# Patient Record
Sex: Male | Born: 1940 | Race: White | Hispanic: No | Marital: Married | Smoking: Former smoker
Health system: Southern US, Community
[De-identification: ages and names within clinical notes are randomized; demographics above are authoritative.]

## PROBLEM LIST (undated history)

## (undated) DIAGNOSIS — Z9889 Other specified postprocedural states: Secondary | ICD-10-CM

## (undated) DIAGNOSIS — R112 Nausea with vomiting, unspecified: Secondary | ICD-10-CM

## (undated) DIAGNOSIS — I499 Cardiac arrhythmia, unspecified: Secondary | ICD-10-CM

## (undated) DIAGNOSIS — Z8489 Family history of other specified conditions: Secondary | ICD-10-CM

## (undated) DIAGNOSIS — K274 Chronic or unspecified peptic ulcer, site unspecified, with hemorrhage: Secondary | ICD-10-CM

## (undated) DIAGNOSIS — M1990 Unspecified osteoarthritis, unspecified site: Secondary | ICD-10-CM

## (undated) DIAGNOSIS — C801 Malignant (primary) neoplasm, unspecified: Secondary | ICD-10-CM

## (undated) DIAGNOSIS — J189 Pneumonia, unspecified organism: Secondary | ICD-10-CM

## (undated) DIAGNOSIS — J45909 Unspecified asthma, uncomplicated: Secondary | ICD-10-CM

## (undated) HISTORY — PX: ROTATOR CUFF REPAIR: SHX139

---
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
## 1966-10-25 HISTORY — PX: APPENDECTOMY: SHX54

## 1980-10-25 DIAGNOSIS — K284 Chronic or unspecified gastrojejunal ulcer with hemorrhage: Secondary | ICD-10-CM

## 1980-10-25 DIAGNOSIS — K274 Chronic or unspecified peptic ulcer, site unspecified, with hemorrhage: Secondary | ICD-10-CM

## 1980-10-25 HISTORY — DX: Chronic or unspecified gastrojejunal ulcer with hemorrhage: K28.4

## 1980-10-25 HISTORY — DX: Chronic or unspecified peptic ulcer, site unspecified, with hemorrhage: K27.4

## 1999-10-26 HISTORY — PX: EYE SURGERY: SHX253

## 1999-10-26 HISTORY — PX: CHOLECYSTECTOMY: SHX55

## 2006-08-15 ENCOUNTER — Ambulatory Visit: Payer: Self-pay

## 2006-09-08 ENCOUNTER — Ambulatory Visit: Payer: Self-pay | Admitting: Internal Medicine

## 2006-10-25 DIAGNOSIS — C801 Malignant (primary) neoplasm, unspecified: Secondary | ICD-10-CM

## 2006-10-25 HISTORY — DX: Malignant (primary) neoplasm, unspecified: C80.1

## 2007-04-21 DIAGNOSIS — C4491 Basal cell carcinoma of skin, unspecified: Secondary | ICD-10-CM

## 2007-04-21 HISTORY — DX: Basal cell carcinoma of skin, unspecified: C44.91

## 2008-05-21 ENCOUNTER — Ambulatory Visit: Payer: Self-pay | Admitting: Internal Medicine

## 2008-09-11 ENCOUNTER — Ambulatory Visit: Payer: Self-pay | Admitting: Urology

## 2008-10-07 ENCOUNTER — Ambulatory Visit: Payer: Self-pay | Admitting: Specialist

## 2008-12-11 ENCOUNTER — Ambulatory Visit: Payer: Self-pay | Admitting: Internal Medicine

## 2009-08-05 ENCOUNTER — Ambulatory Visit: Payer: Self-pay

## 2009-10-15 ENCOUNTER — Ambulatory Visit: Payer: Self-pay | Admitting: Physician Assistant

## 2010-12-29 ENCOUNTER — Ambulatory Visit: Payer: Self-pay | Admitting: Internal Medicine

## 2012-07-19 ENCOUNTER — Ambulatory Visit: Payer: Self-pay | Admitting: Internal Medicine

## 2012-09-27 DIAGNOSIS — R972 Elevated prostate specific antigen [PSA]: Secondary | ICD-10-CM | POA: Insufficient documentation

## 2012-09-27 DIAGNOSIS — N529 Male erectile dysfunction, unspecified: Secondary | ICD-10-CM | POA: Insufficient documentation

## 2012-09-27 DIAGNOSIS — Z8042 Family history of malignant neoplasm of prostate: Secondary | ICD-10-CM | POA: Insufficient documentation

## 2012-09-27 DIAGNOSIS — K4090 Unilateral inguinal hernia, without obstruction or gangrene, not specified as recurrent: Secondary | ICD-10-CM | POA: Insufficient documentation

## 2012-09-27 DIAGNOSIS — N403 Nodular prostate with lower urinary tract symptoms: Secondary | ICD-10-CM | POA: Insufficient documentation

## 2013-01-19 ENCOUNTER — Inpatient Hospital Stay: Payer: Self-pay | Admitting: Internal Medicine

## 2013-01-19 LAB — BASIC METABOLIC PANEL
Anion Gap: 4 — ABNORMAL LOW (ref 7–16)
BUN: 16 mg/dL (ref 7–18)
Chloride: 105 mmol/L (ref 98–107)
EGFR (Non-African Amer.): >60
Osmolality: 276 (ref 275–301)
Sodium: 138 mmol/L (ref 136–145)

## 2013-01-19 LAB — CBC WITH DIFFERENTIAL/PLATELET
Basophil %: 0.6 %
Eosinophil %: 0.5 %
HGB: 15.2 g/dL (ref 13.0–18.0)
Lymphocyte #: 2.2 10*3/uL (ref 1.0–3.6)
Lymphocyte %: 19.8 %
MCH: 29.6 pg (ref 26.0–34.0)
MCHC: 33.8 g/dL (ref 32.0–36.0)
MCV: 88 fL (ref 80–100)
Monocyte #: 0.9 x10 3/mm (ref 0.2–1.0)
Neutrophil %: 71.4 %
Platelet: 152 10*3/uL (ref 150–440)
RDW: 13.5 % (ref 11.5–14.5)
WBC: 11.1 10*3/uL — ABNORMAL HIGH (ref 3.8–10.6)

## 2013-01-20 LAB — CBC WITH DIFFERENTIAL/PLATELET
Basophil #: 0.1 x10 3/mm 3
Basophil %: 0.7 %
Eosinophil #: 0 x10 3/mm 3
Eosinophil %: 0.6 %
HCT: 41.7 %
HGB: 14.1 g/dL
Lymphocyte %: 27.9 %
Lymphs Abs: 2.3 x10 3/mm 3
MCH: 29.5 pg
MCHC: 33.7 g/dL
MCV: 87 fL
Monocyte #: 0.9 "x10 3/mm "
Monocyte %: 10.3 %
Neutrophil #: 5.1 x10 3/mm 3
Neutrophil %: 60.5 %
Platelet: 140 x10 3/mm 3 — ABNORMAL LOW
RBC: 4.77 x10 6/mm 3
RDW: 14.1 %
WBC: 8.4 x10 3/mm 3

## 2013-01-20 LAB — BASIC METABOLIC PANEL WITH GFR
Anion Gap: 6 — ABNORMAL LOW
BUN: 15 mg/dL
Calcium, Total: 8.1 mg/dL — ABNORMAL LOW
Chloride: 107 mmol/L
Co2: 26 mmol/L
Creatinine: 1.04 mg/dL
EGFR (African American): >60
EGFR (Non-African Amer.): >60
Glucose: 92 mg/dL
Osmolality: 278
Potassium: 3.7 mmol/L
Sodium: 139 mmol/L

## 2013-01-21 LAB — CBC WITH DIFFERENTIAL/PLATELET
Basophil #: 0 10*3/uL (ref 0.0–0.1)
Eosinophil #: 0.3 10*3/uL (ref 0.0–0.7)
HGB: 13.9 g/dL (ref 13.0–18.0)
Lymphocyte #: 2.4 10*3/uL (ref 1.0–3.6)
Lymphocyte %: 37.9 %
MCHC: 34.9 g/dL (ref 32.0–36.0)
MCV: 86 fL (ref 80–100)
Monocyte %: 11.1 %
Neutrophil #: 2.9 10*3/uL (ref 1.4–6.5)
Platelet: 142 10*3/uL — ABNORMAL LOW (ref 150–440)
RDW: 13.7 % (ref 11.5–14.5)

## 2013-09-10 ENCOUNTER — Ambulatory Visit: Payer: Self-pay | Admitting: Unknown Physician Specialty

## 2013-10-01 DIAGNOSIS — N2889 Other specified disorders of kidney and ureter: Secondary | ICD-10-CM | POA: Insufficient documentation

## 2014-05-23 DIAGNOSIS — I499 Cardiac arrhythmia, unspecified: Secondary | ICD-10-CM | POA: Insufficient documentation

## 2014-05-23 DIAGNOSIS — J449 Chronic obstructive pulmonary disease, unspecified: Secondary | ICD-10-CM | POA: Insufficient documentation

## 2014-06-17 ENCOUNTER — Ambulatory Visit (INDEPENDENT_AMBULATORY_CARE_PROVIDER_SITE_OTHER): Payer: Medicare Other

## 2014-06-17 ENCOUNTER — Encounter: Payer: Self-pay | Admitting: Podiatry

## 2014-06-17 ENCOUNTER — Ambulatory Visit (INDEPENDENT_AMBULATORY_CARE_PROVIDER_SITE_OTHER): Payer: Medicare Other | Admitting: Podiatry

## 2014-06-17 VITALS — BP 117/75 | HR 52 | Resp 16 | Ht 74.0 in | Wt 215.0 lb

## 2014-06-17 DIAGNOSIS — G589 Mononeuropathy, unspecified: Secondary | ICD-10-CM

## 2014-06-17 DIAGNOSIS — G629 Polyneuropathy, unspecified: Secondary | ICD-10-CM

## 2014-06-17 NOTE — Progress Notes (Signed)
   Subjective:    Patient ID: Keith Gordon, male    DOB: 08/01/1941, 73 y.o.   MRN: [id number]  HPI Comments: Its both of my feet and toes. They hurt, burn and sting. Its been going on since January. Its getting worse. It i walk a long time my legs get tired. i dont do anything for my feet.  Foot Pain Associated symptoms include chills, a fever and headaches.      Review of Systems  Constitutional: Positive for fever and chills.  HENT:       Ringing in ears  Respiratory:       Difficulty breathing  Endocrine: Positive for cold intolerance.  Neurological: Positive for headaches.  Hematological: Bruises/bleeds easily.  All other systems reviewed and are negative.      Objective:   Physical Exam: I have reviewed his past medical history medications allergies surgeries social history and review of systems. Pulses are strongly palpable neurologic sensorium is diminished per Semmes-Weinstein monofilament to the toes and deep tendon reflexes are in non-elicitable. Muscle strength is 5 over 5 dorsiflexors plantar flexors inverters everters all intrinsic musculature appears to be intact orthopedic evaluation demonstrates all joints distal to the ankle a full range of motion without crepitation. Radiographic evaluation does not demonstrate any type of osseous abnormalities that would contribute to this type of complication. Cutaneous evaluation demonstrates supple well hydrated cutis no erythema edema cellulitis drainage or odor.        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 Currently he is no longer being treated for pernicious anemia and his B12 levels have dropped. He also relates a history of back trauma and arthritis that would be consistent with a radiculopathy or spinal stenosis.  Plan: Discussed etiology pathology conservative versus surgical therapies. At this point due to the weakness in  his legs, a heaviness that he feels in the neuropathic symptoms he exhibits I suggested that he discuss with his primary Dr. starting him back on his B12 therapy. I also suggested that he followup with neurosurgery for a back evaluation. He has had a CT scan of his back previously which did demonstrate arthritis and an area of vascular concern. He has been seen by pain management previously but at that time refused epidurals.

## 2014-06-28 ENCOUNTER — Encounter: Payer: Self-pay | Admitting: *Deleted

## 2014-06-28 NOTE — Progress Notes (Signed)
Dr Milinda Pointer wanted to send pt to [facility] to see dr Ronnald Ramp. Dr Ronnald Ramp office faxed form stating pt declined appt and he is going to see a Restaurant manager, fast food. Pt will call dr Ronnald Ramp office if it does not help.

## 2014-11-13 DIAGNOSIS — R339 Retention of urine, unspecified: Secondary | ICD-10-CM | POA: Insufficient documentation

## 2015-02-14 NOTE — H&P (Signed)
PATIENT NAME:  Keith Gordon, Keith Gordon MR#:  [id number] DATE OF BIRTH:  [birth year]  DATE OF ADMISSION:  01/19/2013  CHIEF COMPLAINT:  Left lower quadrant abdominal pain, difficulty in urination.   HISTORY OF PRESENT ILLNESS: The patient is a 74 year old male with a history of COPD, chronic back pain, diverticulosis who presents to the clinic complaining of pain in the left lower aspect of the abdomen that started 3 days back. He was initially seen by Nani Gasser  PA on the 27th of March and was prescribed a course of Cipro and Flagyl. The patient states that he has been taking this for the last day without any significant improvement in symptoms. He also has been experiencing difficulty in urination and describes a pressure-like feeling prior to urination. The patient also states that he has not had a bowel movement since yesterday morning and has been complaining of nausea. His pain was initially a dull ache, but has progressively gotten worse in the last 24 to 48 hours. He denies any chest pain. No shortness of breath. No ankle edema.   PAST MEDICAL HISTORY:  Significant for gastroesophageal reflux disease, COPD, peptic ulcer disease.   PAST SURGICAL HISTORY:  Significant for tonsillectomy, adenoidectomy, appendectomy, left rotator cuff surgery x 2, tear duct surgery, history of previous cholecystectomy.   FAMILY HISTORY:  Positive for TB in his mother.   CURRENT MEDICATIONS: Advil 200 mg as needed, vitamin B12 IM every 3 months, Prilosec 20 mg once a day, Cipro 5 mg p.o. b.i.d., Flagyl 5 mg t.i.d. started yesterday and Voltaren Gel that he takes as needed.   ALLERGIES:  HE IS ALLERGIC TO SULFA.   PHYSICAL EXAMINATION: VITAL SIGNS:  Weight 250 pounds, blood pressure 100/72, pulse was 90. He appeared to be in significant distress.  HEENT:  NCAT. Pupils equal and reactive to light. Tympanic membranes are clear.  NECK:  Supple. No thyromegaly. LUNGS:  Crackles heard in the left base posteriorly,  scattered inspiratory and expiratory wheezes.  ABDOMEN: Soft. Evidence of tenderness in the left lower quadrant area with guarding. No rebound.  EXTREMITIES:  No edema.  NEUROLOGIC: Alert and oriented x 3. No obvious focal deficits.  LABORATORY DATA DONE TODAY:  Showed  hemoglobin 15.5, WBC count 10.6, platelets 171, creatinine 1.2, BUN 16, calcium 9.6, glucose 84, potassium 4.4, total protein 7. Liver function tests were normal. Sodium 138. Urinalysis was clear. Zero to 1 RBCs, zero WBCs. No bacteria.   IMPRESSION:  Acute diverticulitis.  The patient in significant pain in spite of oral antibiotic therapy. Advised admission to [facility]. Will obtain CT scan of the abdomen and pelvis with contrast. Will also start Demerol 25 mg every 8 hours p.r.n. for pain relief. Continue proton pump inhibitor. At this point we will manage patient medically with IV antibiotics and decide on surgical consultation based on the CT results.     ____________________________ Tracie Harrier, MD vh:ce D: 01/19/2013 17:02:54 ET T: 01/19/2013 17:54:44 ET JOB#: [id number]  cc: Tracie Harrier, MD, <Dictator> Tracie Harrier MD ELECTRONICALLY SIGNED 02/04/2013 13:45

## 2015-02-14 NOTE — Discharge Summary (Signed)
PATIENT NAME:  Keith Gordon, Keith Gordon DATE OF BIRTH:  11/23/1940  DATE OF ADMISSION:  01/19/2013 DATE OF DISCHARGE:  01/21/2013  PRIMARY CARE PHYSICIAN:  Tracie Harrier, MD  DISCHARGING PHYSICIAN:  Cheral Marker. Ola Spurr, MD   DISCHARGE DIAGNOSIS:  Diverticulitis unresponsive to oral antibiotics.   HISTORY OF PRESENT ILLNESS:  Please see admission history and physical. Briefly, this is a 74 year old with known diverticulitis times several episodes in the past. He was seen by Dr. Ginette Pitman as an outpatient and given oral Cipro and Flagyl. However, he worsened acutely and had severe left lower quadrant abdominal pain. He was admitted for further evaluation and treatment.   HOSPITAL COURSE BY ISSUE:  Diverticulitis. CT scan confirmed this, but did not show any abscess or free air. He was started on IV Cipro and Flagyl. Over 2 days, his pain improved and his diarrhea resolved. He had 1 episode of vomiting when he received Demerol. His pain was otherwise controlled with ibuprofen. On the day of discharge, he has mild tenderness to palpation in the left lower quadrant, but no white count and no fever. He is tolerating a full liquid diet.   DISCHARGE MEDICATIONS:   1.  Ciprofloxacin 500 mg twice a day for 10 days.  2.  Flagyl 500 mg 3 times daily for 10 days.  3.  Advil 200 mg 2 tablets once to twice a day as needed for pain.   DISCHARGE DIET:  Low sodium, full liquid for 1 to 2 days, then can resume regular.   DISCHARGE ACTIVITY:  As tolerated.   FOLLOWUP:  The patient is to follow up with Dr. Ginette Pitman in 1 to 2 weeks. The patient will call if he has worsening symptoms including abdominal pain, nausea, vomiting, diarrhea or other concerning symptoms.   TIME SPENT:  This discharge took 35 minutes.    ____________________________ Cheral Marker. Ola Spurr, MD dpf:si D: 01/21/2013 12:52:52 ET T: 01/21/2013 15:29:39 ET JOB#: [id number]  cc: Cheral Marker. Ola Spurr, MD, <Dictator> Emmaleigh Longo Ola Spurr  MD ELECTRONICALLY SIGNED 01/24/2013 15:43

## 2015-06-19 DIAGNOSIS — L570 Actinic keratosis: Secondary | ICD-10-CM

## 2015-06-19 HISTORY — DX: Actinic keratosis: L57.0

## 2016-02-23 DIAGNOSIS — M25579 Pain in unspecified ankle and joints of unspecified foot: Secondary | ICD-10-CM | POA: Insufficient documentation

## 2016-07-16 ENCOUNTER — Other Ambulatory Visit: Payer: Self-pay | Admitting: Internal Medicine

## 2016-07-16 DIAGNOSIS — M25552 Pain in left hip: Secondary | ICD-10-CM

## 2016-07-20 ENCOUNTER — Ambulatory Visit
Admission: RE | Admit: 2016-07-20 | Discharge: 2016-07-20 | Disposition: A | Discharge status: Home / Self Care | Payer: No Typology Code available for payment source | Source: Ambulatory Visit | Attending: Internal Medicine | Admitting: Internal Medicine

## 2016-07-20 DIAGNOSIS — X58XXXA Exposure to other specified factors, initial encounter: Secondary | ICD-10-CM | POA: Diagnosis not present

## 2016-07-20 DIAGNOSIS — S76012A Strain of muscle, fascia and tendon of left hip, initial encounter: Secondary | ICD-10-CM | POA: Insufficient documentation

## 2016-07-20 DIAGNOSIS — M25552 Pain in left hip: Secondary | ICD-10-CM | POA: Diagnosis present

## 2016-12-23 DIAGNOSIS — B3742 Candidal balanitis: Secondary | ICD-10-CM | POA: Insufficient documentation

## 2017-02-07 ENCOUNTER — Encounter
Admission: RE | Admit: 2017-02-07 | Discharge: 2017-02-07 | Disposition: A | Discharge status: Home / Self Care | Payer: Medicare Other | Source: Ambulatory Visit | Attending: Surgery | Admitting: Surgery

## 2017-02-07 DIAGNOSIS — R001 Bradycardia, unspecified: Secondary | ICD-10-CM | POA: Insufficient documentation

## 2017-02-07 DIAGNOSIS — Z136 Encounter for screening for cardiovascular disorders: Secondary | ICD-10-CM

## 2017-02-07 DIAGNOSIS — M76892 Other specified enthesopathies of left lower limb, excluding foot: Secondary | ICD-10-CM | POA: Insufficient documentation

## 2017-02-07 DIAGNOSIS — Z01818 Encounter for other preprocedural examination: Secondary | ICD-10-CM | POA: Diagnosis present

## 2017-02-07 DIAGNOSIS — J449 Chronic obstructive pulmonary disease, unspecified: Secondary | ICD-10-CM | POA: Insufficient documentation

## 2017-02-07 HISTORY — DX: Chronic or unspecified peptic ulcer, site unspecified, with hemorrhage: K27.4

## 2017-02-07 HISTORY — DX: Family history of other specified conditions: Z84.89

## 2017-02-07 HISTORY — DX: Other specified postprocedural states: Z98.890

## 2017-02-07 HISTORY — DX: Unspecified osteoarthritis, unspecified site: M19.90

## 2017-02-07 HISTORY — DX: Pneumonia, unspecified organism: J18.9

## 2017-02-07 HISTORY — DX: Malignant (primary) neoplasm, unspecified: C80.1

## 2017-02-07 HISTORY — DX: Unspecified asthma, uncomplicated: J45.909

## 2017-02-07 HISTORY — DX: Nausea with vomiting, unspecified: R11.2

## 2017-02-07 HISTORY — DX: Cardiac arrhythmia, unspecified: I49.9

## 2017-02-07 NOTE — Pre-Procedure Instructions (Signed)
Pt reports severe nausea and vomiting with his lap chole he was admitted for 2 days to control nausea and vomiting.  Spoke to Dr. Andree Elk regarding above in relation to upcoming surgery.  Discussed spinal anesthesia and ordering pre-op scopolamine patch and pepcid.

## 2017-02-07 NOTE — Patient Instructions (Signed)
  Your procedure is scheduled [id number] April 24 , 2018. Report to Same Day Surgery. To find out your arrival time please call [phone] between 1PM - 3PM on Monday February 14, 2017.  Remember: Instructions that are not followed completely may result in serious medical risk, up to and including death, or upon the discretion of your surgeon and anesthesiologist your surgery may need to be rescheduled.    _x___ 1. Do not eat food or drink liquids after midnight. No gum chewing or hard candies.     ____ 2. No Alcohol for 24 hours before or after surgery.   ____ 3. Bring all medications with you on the day of surgery if instructed.    __x__ 4. Notify your doctor if there is any change in your medical condition     (cold, fever, infections).    _____ 5. No smoking 24 hours prior to surgery.     Do not wear jewelry, make-up, hairpins, clips or nail polish.  Do not wear lotions, powders, or perfumes.   Do not shave 48 hours prior to surgery. Men may shave face and neck.  Do not bring valuables to the hospital.    [facility] is not responsible for any belongings or valuables.               Contacts, dentures or bridgework may not be worn into surgery.  Leave your suitcase in the car. After surgery it may be brought to your room.  For patients admitted to the hospital, discharge time is determined by your treatment team.   Patients discharged the day of surgery will not be allowed to drive home.    Please read over the following fact sheets that you were given:   [facility] Preparing for Surgery  ____ Take these medicines the morning of surgery with A SIP OF WATER: NONE    ____ Fleet Enema (as directed)   _x___ Use CHG Soap as directed on instruction sheet  _x___ Use inhalers on the day of surgery and bring to hospital day of surgery  ____ Stop metformin 2 days prior to surgery    ____ Take 1/2 of usual insulin dose the night before surgery and none on the morning of surgery.    ____ Stop Coumadin/Plavix/aspirin on does not apply.  _x___ Stop Anti-inflammatories such as Advil, Aleve, Ibuprofen, Motrin, Naproxen, Naprosyn, Goodies powders or aspirin products. OK to take Tylenol.   ____ Stop supplements until after surgery.    ____ Bring C-Pap to the hospital.

## 2017-02-15 ENCOUNTER — Encounter
Admission: RE | Disposition: A | Discharge status: Home / Self Care | Payer: Self-pay | Source: Ambulatory Visit | Attending: Surgery

## 2017-02-15 ENCOUNTER — Ambulatory Visit
Admission: RE | Admit: 2017-02-15 | Discharge: 2017-02-15 | Disposition: A | Discharge status: Home / Self Care | Payer: Medicare Other | Source: Ambulatory Visit | Attending: Surgery | Admitting: Surgery

## 2017-02-15 ENCOUNTER — Inpatient Hospital Stay: Payer: Medicare Other | Admitting: Anesthesiology

## 2017-02-15 ENCOUNTER — Encounter: Payer: Self-pay | Admitting: *Deleted

## 2017-02-15 DIAGNOSIS — Z888 Allergy status to other drugs, medicaments and biological substances status: Secondary | ICD-10-CM | POA: Insufficient documentation

## 2017-02-15 DIAGNOSIS — S76019A Strain of muscle, fascia and tendon of unspecified hip, initial encounter: Secondary | ICD-10-CM | POA: Insufficient documentation

## 2017-02-15 DIAGNOSIS — Z7952 Long term (current) use of systemic steroids: Secondary | ICD-10-CM | POA: Insufficient documentation

## 2017-02-15 DIAGNOSIS — Z791 Long term (current) use of non-steroidal anti-inflammatories (NSAID): Secondary | ICD-10-CM | POA: Diagnosis not present

## 2017-02-15 DIAGNOSIS — M1990 Unspecified osteoarthritis, unspecified site: Secondary | ICD-10-CM | POA: Insufficient documentation

## 2017-02-15 DIAGNOSIS — Z79899 Other long term (current) drug therapy: Secondary | ICD-10-CM | POA: Insufficient documentation

## 2017-02-15 DIAGNOSIS — J449 Chronic obstructive pulmonary disease, unspecified: Secondary | ICD-10-CM | POA: Insufficient documentation

## 2017-02-15 DIAGNOSIS — Z85828 Personal history of other malignant neoplasm of skin: Secondary | ICD-10-CM | POA: Diagnosis not present

## 2017-02-15 DIAGNOSIS — Z882 Allergy status to sulfonamides status: Secondary | ICD-10-CM | POA: Diagnosis not present

## 2017-02-15 DIAGNOSIS — Z87891 Personal history of nicotine dependence: Secondary | ICD-10-CM | POA: Diagnosis not present

## 2017-02-15 DIAGNOSIS — K219 Gastro-esophageal reflux disease without esophagitis: Secondary | ICD-10-CM | POA: Insufficient documentation

## 2017-02-15 DIAGNOSIS — Z8711 Personal history of peptic ulcer disease: Secondary | ICD-10-CM | POA: Diagnosis not present

## 2017-02-15 DIAGNOSIS — M7602 Gluteal tendinitis, left hip: Secondary | ICD-10-CM | POA: Insufficient documentation

## 2017-02-15 HISTORY — PX: OPEN SURGICAL REPAIR OF GLUTEAL TENDON: SHX5995

## 2017-02-15 SURGERY — REPAIR, TENDON, GLUTEUS MEDIUS, OPEN
Anesthesia: General | Laterality: Left | Wound class: Clean

## 2017-02-15 MED ORDER — PROPOFOL 10 MG/ML IV BOLUS
INTRAVENOUS | Status: DC | PRN
  Administered 2017-02-15: 150 mg via INTRAVENOUS

## 2017-02-15 MED ORDER — BUPIVACAINE-EPINEPHRINE (PF) 0.25% -1:200000 IJ SOLN
INTRAMUSCULAR | Status: AC
  Filled 2017-02-15: qty 30

## 2017-02-15 MED ORDER — METOCLOPRAMIDE HCL 5 MG/ML IJ SOLN: 5.0000 mg | Freq: Three times a day (TID) | INTRAMUSCULAR | Status: DC | PRN

## 2017-02-15 MED ORDER — POTASSIUM CHLORIDE IN NACL 20-0.9 MEQ/L-% IV SOLN: INTRAVENOUS | Status: DC

## 2017-02-15 MED ORDER — ONDANSETRON HCL 4 MG/2ML IJ SOLN
INTRAMUSCULAR | Status: DC | PRN
  Administered 2017-02-15: 4 mg via INTRAVENOUS

## 2017-02-15 MED ORDER — TRAMADOL HCL 50 MG PO TABS
ORAL_TABLET | ORAL | Status: AC
  Administered 2017-02-15: 50 mg via ORAL
  Filled 2017-02-15: qty 1

## 2017-02-15 MED ORDER — NEOMYCIN-POLYMYXIN B GU 40-200000 IR SOLN
Status: AC
  Filled 2017-02-15: qty 4

## 2017-02-15 MED ORDER — LIDOCAINE HCL (PF) 2 % IJ SOLN
INTRAMUSCULAR | Status: AC
  Filled 2017-02-15: qty 2

## 2017-02-15 MED ORDER — METOCLOPRAMIDE HCL 10 MG PO TABS: 5.0000 mg | ORAL_TABLET | Freq: Three times a day (TID) | ORAL | Status: DC | PRN

## 2017-02-15 MED ORDER — SODIUM CHLORIDE 0.9 % IJ SOLN
INTRAMUSCULAR | Status: AC
  Filled 2017-02-15: qty 50

## 2017-02-15 MED ORDER — NEOMYCIN-POLYMYXIN B GU 40-200000 IR SOLN
Status: DC | PRN
  Administered 2017-02-15: 4 mL

## 2017-02-15 MED ORDER — BUPIVACAINE-EPINEPHRINE (PF) 0.5% -1:200000 IJ SOLN
INTRAMUSCULAR | Status: DC | PRN
  Administered 2017-02-15: 30 mL via PERINEURAL

## 2017-02-15 MED ORDER — LIDOCAINE HCL (CARDIAC) 20 MG/ML IV SOLN
INTRAVENOUS | Status: DC | PRN
  Administered 2017-02-15: 100 mg via INTRAVENOUS

## 2017-02-15 MED ORDER — TRAMADOL HCL 50 MG PO TABS
ORAL_TABLET | ORAL | Status: AC
  Filled 2017-02-15: qty 1

## 2017-02-15 MED ORDER — TRAMADOL HCL 50 MG PO TABS
50.0000 mg | ORAL_TABLET | Freq: Once | ORAL | Status: AC
  Administered 2017-02-15: 50 mg via ORAL

## 2017-02-15 MED ORDER — ONDANSETRON HCL 4 MG/2ML IJ SOLN
4.0000 mg | Freq: Once | INTRAMUSCULAR | Status: AC | PRN
  Administered 2017-02-15: 4 mg via INTRAVENOUS

## 2017-02-15 MED ORDER — FENTANYL CITRATE (PF) 100 MCG/2ML IJ SOLN
25.0000 ug | INTRAMUSCULAR | Status: DC | PRN
  Administered 2017-02-15 (×3): 50 ug via INTRAVENOUS

## 2017-02-15 MED ORDER — FENTANYL CITRATE (PF) 100 MCG/2ML IJ SOLN
INTRAMUSCULAR | Status: DC | PRN
Start: 2017-02-15 — End: 2017-02-15
  Administered 2017-02-15: 150 ug via INTRAVENOUS
  Administered 2017-02-15: 100 ug via INTRAVENOUS

## 2017-02-15 MED ORDER — FAMOTIDINE 20 MG PO TABS
ORAL_TABLET | ORAL | Status: AC
  Filled 2017-02-15: qty 1

## 2017-02-15 MED ORDER — REMIFENTANIL HCL 1 MG IV SOLR
INTRAVENOUS | Status: AC
  Filled 2017-02-15: qty 1000

## 2017-02-15 MED ORDER — DEXTROSE 5 % IV SOLN
INTRAVENOUS | Status: DC | PRN
  Administered 2017-02-15: 10 ug/min via INTRAVENOUS

## 2017-02-15 MED ORDER — BUPIVACAINE-EPINEPHRINE (PF) 0.5% -1:200000 IJ SOLN
INTRAMUSCULAR | Status: AC
  Filled 2017-02-15: qty 30

## 2017-02-15 MED ORDER — ONDANSETRON HCL 4 MG/2ML IJ SOLN
INTRAMUSCULAR | Status: AC
Start: 2017-02-15 — End: 2017-02-15
  Administered 2017-02-15: 4 mg via INTRAVENOUS
  Filled 2017-02-15: qty 2

## 2017-02-15 MED ORDER — LACTATED RINGERS IV SOLN
INTRAVENOUS | Status: DC
  Administered 2017-02-15 (×2): via INTRAVENOUS

## 2017-02-15 MED ORDER — SODIUM CHLORIDE 0.9 % IV SOLN
INTRAVENOUS | Status: DC | PRN
  Administered 2017-02-15: 8 ug/kg/min via INTRAVENOUS

## 2017-02-15 MED ORDER — KETOROLAC TROMETHAMINE 30 MG/ML IJ SOLN
30.0000 mg | Freq: Once | INTRAMUSCULAR | Status: AC
  Administered 2017-02-15: 30 mg via INTRAVENOUS

## 2017-02-15 MED ORDER — SCOPOLAMINE 1 MG/3DAYS TD PT72
1.0000 | MEDICATED_PATCH | Freq: Once | TRANSDERMAL | Status: DC
  Administered 2017-02-15: 1.5 mg via TRANSDERMAL

## 2017-02-15 MED ORDER — FENTANYL CITRATE (PF) 100 MCG/2ML IJ SOLN
INTRAMUSCULAR | Status: AC
  Filled 2017-02-15: qty 2

## 2017-02-15 MED ORDER — TRAMADOL HCL 50 MG PO TABS
50.0000 mg | ORAL_TABLET | Freq: Four times a day (QID) | ORAL | Status: DC
  Administered 2017-02-15: 50 mg via ORAL

## 2017-02-15 MED ORDER — EPHEDRINE SULFATE 50 MG/ML IJ SOLN
INTRAMUSCULAR | Status: DC | PRN
  Administered 2017-02-15: 10 mg via INTRAVENOUS

## 2017-02-15 MED ORDER — TRAMADOL HCL 50 MG PO TABS: 50.0000 mg | ORAL_TABLET | Freq: Four times a day (QID) | ORAL | Status: DC | PRN

## 2017-02-15 MED ORDER — TRANEXAMIC ACID 1000 MG/10ML IV SOLN
INTRAVENOUS | Status: AC
  Filled 2017-02-15: qty 10

## 2017-02-15 MED ORDER — ONDANSETRON 4 MG PO TBDP: 4.0000 mg | ORAL_TABLET | Freq: Three times a day (TID) | ORAL | 2 refills | Status: DC | PRN

## 2017-02-15 MED ORDER — ACETAMINOPHEN 10 MG/ML IV SOLN
INTRAVENOUS | Status: DC | PRN
  Administered 2017-02-15: 1000 mg via INTRAVENOUS

## 2017-02-15 MED ORDER — ONDANSETRON HCL 4 MG PO TABS: 4.0000 mg | ORAL_TABLET | Freq: Four times a day (QID) | ORAL | Status: DC | PRN

## 2017-02-15 MED ORDER — SUGAMMADEX SODIUM 200 MG/2ML IV SOLN
INTRAVENOUS | Status: DC | PRN
  Administered 2017-02-15: 50 mg via INTRAVENOUS

## 2017-02-15 MED ORDER — FAMOTIDINE 20 MG PO TABS
20.0000 mg | ORAL_TABLET | Freq: Once | ORAL | Status: AC
  Administered 2017-02-15: 20 mg via ORAL

## 2017-02-15 MED ORDER — ONDANSETRON HCL 4 MG/2ML IJ SOLN: 4.0000 mg | Freq: Four times a day (QID) | INTRAMUSCULAR | Status: DC | PRN

## 2017-02-15 MED ORDER — ROCURONIUM BROMIDE 100 MG/10ML IV SOLN
INTRAVENOUS | Status: DC | PRN
  Administered 2017-02-15: 50 mg via INTRAVENOUS

## 2017-02-15 MED ORDER — KETOROLAC TROMETHAMINE 30 MG/ML IJ SOLN
INTRAMUSCULAR | Status: AC
  Administered 2017-02-15: 30 mg via INTRAVENOUS
  Filled 2017-02-15: qty 1

## 2017-02-15 MED ORDER — REMIFENTANIL HCL 1 MG IV SOLR
INTRAVENOUS | Status: DC | PRN
  Administered 2017-02-15: .05 ug/kg/min via INTRAVENOUS

## 2017-02-15 MED ORDER — FENTANYL CITRATE (PF) 250 MCG/5ML IJ SOLN
INTRAMUSCULAR | Status: AC
  Filled 2017-02-15: qty 5

## 2017-02-15 MED ORDER — PROPOFOL 500 MG/50ML IV EMUL
INTRAVENOUS | Status: AC
  Filled 2017-02-15: qty 50

## 2017-02-15 MED ORDER — FENTANYL CITRATE (PF) 100 MCG/2ML IJ SOLN
INTRAMUSCULAR | Status: AC
  Administered 2017-02-15: 50 ug via INTRAVENOUS
  Filled 2017-02-15: qty 2

## 2017-02-15 MED ORDER — MIDAZOLAM HCL 2 MG/2ML IJ SOLN
INTRAMUSCULAR | Status: AC
  Filled 2017-02-15: qty 2

## 2017-02-15 MED ORDER — PROPOFOL 500 MG/50ML IV EMUL
INTRAVENOUS | Status: DC | PRN
  Administered 2017-02-15: 80 ug/kg/min via INTRAVENOUS

## 2017-02-15 MED ORDER — ONDANSETRON HCL 4 MG/2ML IJ SOLN
INTRAMUSCULAR | Status: AC
  Filled 2017-02-15: qty 2

## 2017-02-15 MED ORDER — CEFAZOLIN SODIUM-DEXTROSE 2-4 GM/100ML-% IV SOLN
2.0000 g | Freq: Once | INTRAVENOUS | Status: AC
  Administered 2017-02-15: 2 g via INTRAVENOUS

## 2017-02-15 MED ORDER — DEXAMETHASONE SODIUM PHOSPHATE 4 MG/ML IJ SOLN
INTRAMUSCULAR | Status: DC | PRN
  Administered 2017-02-15: 6 mg via INTRAVENOUS

## 2017-02-15 MED ORDER — TRAMADOL HCL 50 MG PO TABS: 50.0000 mg | ORAL_TABLET | Freq: Four times a day (QID) | ORAL | 0 refills | Status: DC | PRN

## 2017-02-15 MED ORDER — BUPIVACAINE LIPOSOME 1.3 % IJ SUSP
INTRAMUSCULAR | Status: AC
  Filled 2017-02-15: qty 20

## 2017-02-15 MED ORDER — CEFAZOLIN SODIUM-DEXTROSE 2-4 GM/100ML-% IV SOLN
INTRAVENOUS | Status: AC
  Filled 2017-02-15: qty 100

## 2017-02-15 MED ORDER — MIDAZOLAM HCL 2 MG/2ML IJ SOLN
INTRAMUSCULAR | Status: DC | PRN
  Administered 2017-02-15: 1 mg via INTRAVENOUS

## 2017-02-15 MED ORDER — ACETAMINOPHEN 10 MG/ML IV SOLN
INTRAVENOUS | Status: AC
  Filled 2017-02-15: qty 100

## 2017-02-15 MED ORDER — SCOPOLAMINE 1 MG/3DAYS TD PT72
MEDICATED_PATCH | TRANSDERMAL | Status: AC
  Filled 2017-02-15: qty 1

## 2017-02-15 SURGICAL SUPPLY — 49 items
ANCHOR SUT BIO SW 4.75X19.1 (Anchor) ×9 IMPLANT
BLADE SURG SZ20 CARB STEEL (BLADE) ×3 IMPLANT
CANISTER SUCT 1200ML W/VALVE (MISCELLANEOUS) ×3 IMPLANT
CANISTER SUCT 3000ML PPV (MISCELLANEOUS) ×3 IMPLANT
CHLORAPREP W/TINT 26ML (MISCELLANEOUS) ×6 IMPLANT
DRAPE IMP U-DRAPE 54X76 (DRAPES) ×3 IMPLANT
DRAPE INCISE IOBAN 66X60 STRL (DRAPES) ×3 IMPLANT
DRAPE SHEET LG 3/4 BI-LAMINATE (DRAPES) ×3 IMPLANT
DRAPE SURG 17X11 SM STRL (DRAPES) ×6 IMPLANT
DRSG OPSITE POSTOP 4X10 (GAUZE/BANDAGES/DRESSINGS) ×3 IMPLANT
DRSG OPSITE POSTOP 4X12 (GAUZE/BANDAGES/DRESSINGS) IMPLANT
DRSG OPSITE POSTOP 4X14 (GAUZE/BANDAGES/DRESSINGS) IMPLANT
ELECT BLADE 6.5 EXT (BLADE) ×3 IMPLANT
ELECT CAUTERY BLADE 6.4 (BLADE) ×3 IMPLANT
FIBER TAPE ×15 IMPLANT
FIBER TAPE 2MM (Orthopedic Implant) ×15 IMPLANT
GLOVE BIO SURGEON STRL SZ7.5 (GLOVE) ×15 IMPLANT
GLOVE BIO SURGEON STRL SZ8 (GLOVE) ×15 IMPLANT
GLOVE BIOGEL PI IND STRL 8 (GLOVE) ×2 IMPLANT
GLOVE BIOGEL PI INDICATOR 8 (GLOVE) ×4
GLOVE INDICATOR 8.0 STRL GRN (GLOVE) ×3 IMPLANT
GOWN STRL REUS W/ TWL LRG LVL3 (GOWN DISPOSABLE) ×1 IMPLANT
GOWN STRL REUS W/ TWL XL LVL3 (GOWN DISPOSABLE) ×1 IMPLANT
GOWN STRL REUS W/TWL LRG LVL3 (GOWN DISPOSABLE) ×2
GOWN STRL REUS W/TWL XL LVL3 (GOWN DISPOSABLE) ×2
HOOD PEEL AWAY FLYTE STAYCOOL (MISCELLANEOUS) ×9 IMPLANT
KIT RM TURNOVER STRD PROC AR (KITS) ×3 IMPLANT
MAYO CATGUT ×3 IMPLANT
NDL SAFETY 18GX1.5 (NEEDLE) ×3 IMPLANT
NEEDLE FILTER BLUNT 18X 1/2SAF (NEEDLE) ×2
NEEDLE FILTER BLUNT 18X1 1/2 (NEEDLE) ×1 IMPLANT
NEEDLE MAYO CATGUT SZ 1.5 (NEEDLE) ×3
NEEDLE MAYO CATGUT SZ 2 (NEEDLE) ×1 IMPLANT
NEEDLE SPNL 20GX3.5 QUINCKE YW (NEEDLE) ×3 IMPLANT
NS IRRIG 1000ML POUR BTL (IV SOLUTION) ×3 IMPLANT
PACK HIP PROSTHESIS (MISCELLANEOUS) ×3 IMPLANT
SPONGE LAP 18X18 5 PK (GAUZE/BANDAGES/DRESSINGS) IMPLANT
STAPLER SKIN PROX 35W (STAPLE) ×3 IMPLANT
SUT ETHIBOND 0 MO6 C/R (SUTURE) ×3 IMPLANT
SUT TICRON 2-0 30IN 311381 (SUTURE) IMPLANT
SUT TIGER TAPE 7 IN WHITE (SUTURE) ×3 IMPLANT
SUT VIC AB 0 CT1 36 (SUTURE) ×3 IMPLANT
SUT VIC AB 1 CT1 36 (SUTURE) ×6 IMPLANT
SUT VIC AB 2-0 CT1 (SUTURE) ×15 IMPLANT
SYR 20CC LL (SYRINGE) IMPLANT
SYR 30ML LL (SYRINGE) ×3 IMPLANT
SYRINGE 10CC LL (SYRINGE) ×3 IMPLANT
SYSTEM IMPL ACL/PCL SWIVILLOCK (Anchor) ×2 IMPLANT
TAPE TRANSPORE STRL 2 31045 (GAUZE/BANDAGES/DRESSINGS) ×3 IMPLANT

## 2017-02-15 NOTE — Anesthesia Postprocedure Evaluation (Signed)
Anesthesia Post Note  Patient: Keith Gordon  Procedure(s) Performed: Procedure(s) (LRB): PRIMARY REPAIR OF THE GLUTEUS MEDIUS TENDON OF HIP (Left)  Patient location during evaluation: PACU Anesthesia Type: General Level of consciousness: awake and alert Pain management: pain level controlled Vital Signs Assessment: post-procedure vital signs reviewed and stable Respiratory status: spontaneous breathing, nonlabored ventilation, respiratory function stable and patient connected to nasal cannula oxygen Cardiovascular status: blood pressure returned to baseline and stable Postop Assessment: no signs of nausea or vomiting Anesthetic complications: no     Last Vitals:  Vitals:   02/15/17 1400 02/15/17 1415  BP: 121/71 129/74  Pulse: 68 77  Resp: 13 13  Temp:  36.7 C    Last Pain:  Vitals:   02/15/17 1410  TempSrc:   PainSc: 3                  Martha Clan

## 2017-02-15 NOTE — H&P (Signed)
Paper H&P to be scanned into permanent record. H&P reviewed and patient re-examined. No changes. 

## 2017-02-15 NOTE — Anesthesia Procedure Notes (Signed)
Procedure Name: Intubation Date/Time: 02/15/2017 11:07 AM Performed by: Rosaria Ferries, Gillie Fleites Pre-anesthesia Checklist: Patient identified, Emergency Drugs available, Suction available and Patient being monitored Patient Re-evaluated:Patient Re-evaluated prior to inductionOxygen Delivery Method: Circle system utilized Preoxygenation: Pre-oxygenation with 100% oxygen Intubation Type: IV induction Laryngoscope Size: Mac and 3 Grade View: Grade I Tube size: 7.0 mm Number of attempts: 1 Placement Confirmation: ETT inserted through vocal cords under direct vision,  positive ETCO2 and breath sounds checked- equal and bilateral Secured at: 23 cm Tube secured with: Tape Dental Injury: Teeth and Oropharynx as per pre-operative assessment

## 2017-02-15 NOTE — Transfer of Care (Signed)
Immediate Anesthesia Transfer of Care Note  Patient: Keith Gordon  Procedure(s) Performed: Procedure(s): PRIMARY REPAIR OF THE GLUTEUS MEDIUS TENDON OF HIP (Left)  Patient Location: PACU  Anesthesia Type:General  Level of Consciousness: awake, alert , oriented and patient cooperative  Airway & Oxygen Therapy: Patient Spontanous Breathing and Patient connected to face mask oxygen  Post-op Assessment: Report given to RN and Post -op Vital signs reviewed and stable  Post vital signs: Reviewed and stable  Last Vitals:  Vitals:   02/15/17 0858  BP: 134/83  Pulse: 70  Resp: 18  Temp: 36.8 C    Last Pain:  Vitals:   02/15/17 0858  TempSrc: Oral  PainSc: 4          Complications: No apparent anesthesia complications

## 2017-02-15 NOTE — Discharge Instructions (Addendum)
°  Apply ice frequently to hip. Take ibuprofen 800 mg TID with meals for 7-10 days, then as necessary. Take tramadol as prescribed when needed.  May supplement with ES Tylenol if necessary. Toe-touch weight-bear on left lower extremity in a hip abduction brace - use walker. Follow-up in 10-14 days or as scheduled.  AMBULATORY SURGERY  DISCHARGE INSTRUCTIONS   1) The drugs that you were given will stay in your system until tomorrow so for the next 24 hours you should not:  A) Drive an automobile B) Make any legal decisions C) Drink any alcoholic beverage   2) You may resume regular meals tomorrow.  Today it is better to start with liquids and gradually work up to solid foods.  You may eat anything you prefer, but it is better to start with liquids, then soup and crackers, and gradually work up to solid foods.   3) Please notify your doctor immediately if you have any unusual bleeding, trouble breathing, redness and pain at the surgery site, drainage, fever, or pain not relieved by medication.    4) Additional Instructions:STOOL SOFTENERS IF TAKING NARCOTICS.  HAVE SOMETHING IN YOUR STOMACH BEFORE TAKING MEDICINES.   Please contact your physician with any problems or Same Day Surgery at [phone], Monday through Friday 6 am to 4 pm, or [place] at [facility] number at [phone].

## 2017-02-15 NOTE — Anesthesia Post-op Follow-up Note (Cosign Needed)
Anesthesia QCDR form completed.        

## 2017-02-15 NOTE — OR Nursing (Signed)
Patient arrived from pacu via stretcher and has remained there due to difficulty getting in and out of bed, due to the brace.  Patient complains of nausea and severe pain.  He has not been able to tolerate crackers but is sipping on soda.  Jola Baptist, RN is working with patient for [facility] to provide pain relief and nausea relief.  Will provide pain medication when patient feels he can keep it down.

## 2017-02-15 NOTE — Op Note (Signed)
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  Post-Op Diagnosis:   Same.  Procedure:   Primary repair of gluteus medius tendon, left hip.  Surgeon:   Pascal Lux, MD  Assistant:   Cameron Proud, PA-C  Anesthesia:   GET  Findings:   As above.  Complications:   None  EBL:   75 cc  Fluids:   1000 cc crystalloid  UOP:   None  TT:   None  Drains:   None  Closure:   Staples  Implants:   Arthrex 4.75 mm SwiveLock anchors 4  Brief Clinical Note:   The patient is a 76 year old male with a 1+ year history of lateral sided left hip pain. His symptoms have persisted despite medications, activity modification, injections, therapy, etc. His history and examination are consistent with a degenerative tear of the gluteus medius tendon, confirmed by MRI scan. The patient presents at this time for primary repair of the gluteus medius tendon of his left hip.  Procedure:   The patient was brought into the operating room. After adequate general endotracheal intubation and anesthesia was obtained, the patient was repositioned in the right lateral decubitus position and secured using a lateral hip positioner. The left hip and lower extremity were prepped with ChloroPrep solution before being draped sterilely. Preoperative antibiotics were administered. A timeout was performed to verify the appropriate surgical site before a standard posterior approach to the hip was made through an approximately 4-5 inch incision. The incision was carried down through the subcutaneous tissues to expose the gluteal fascia and proximal end of the iliotibial band. These structures were split the length of the incision and the Charnley self-retaining hip retractor placed. The chronically thickened bursal tissues were debrided to expose the chronically torn gluteus medius tendon. The tendon margins were freshened sharply and the exposed  greater tuberosity debrided superficially to stimulate healing.   The tendon was repaired using four 2 mm Arthrex Fiber tapes, two of which were placed through the anterior portion of the tendon whereas the other two were placed through the more superior portion of the muscle/tendon. Each of these four fiber tapes were woven through the tendon before being advanced and secured to the greater trochanter with four Arthrex 4.75 mm SwiveLock anchors. Several additional sutures were placed in a side-to-side fashion through the anterosuperior portion of the tendon/muscle before several of the #0 Fiber wire sutures contained within each anchor were brought back through the tendon to further reinforce the repair. A secure repair was felt to have been achieved.  The wound was copiously irrigated with sterile saline solution using bulb irrigation before the iliotibial band was reapproximated using #1 Vicryl interrupted sutures and the gluteal fascia was closed using a running #1 Vicryl suture. The subcutaneous tissues were closed in several layers using 2-0 Vicryl interrupted sutures before the skin was closed using staples. A sterile occlusive dressing was applied to the wound before the patient was placed into a hip abduction brace. The patient was then rolled back into the supine position on the hospital bed before being awakened, extubated, and returned to the recovery room in satisfactory condition after tolerating the procedure well.

## 2017-02-15 NOTE — Anesthesia Preprocedure Evaluation (Signed)
Anesthesia Evaluation  Patient identified by MRN, date of birth, ID band Patient awake    Reviewed: Allergy & Precautions, H&P , NPO status , Patient's Chart, lab work & pertinent test results, reviewed documented beta blocker date and time   History of Anesthesia Complications (+) PONV, Family history of anesthesia reaction and history of anesthetic complications  Airway Mallampati: II  TM Distance: >3 FB Neck ROM: full    Dental  (+) Caps, Missing, Poor Dentition   Pulmonary neg shortness of breath, asthma , neg sleep apnea, COPD,  COPD inhaler, neg recent URI, former smoker,           Cardiovascular Exercise Tolerance: Good (-) hypertension(-) angina(-) CAD, (-) Past MI, (-) Cardiac Stents and (-) CABG + dysrhythmias (-) Valvular Problems/Murmurs     Neuro/Psych negative neurological ROS  negative psych ROS   GI/Hepatic Neg liver ROS, PUD, GERD  ,  Endo/Other  negative endocrine ROS  Renal/GU negative Renal ROS  negative genitourinary   Musculoskeletal   Abdominal   Peds  Hematology negative hematology ROS (+)   Anesthesia Other Findings Past Medical History: No date: Arthritis No date: Asthma     Comment: as a child 26: Bleeding ulcer 2008: 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 No date: Dysrhythmia No date: Family history of adverse reaction to anesthes*     Comment: son severe nausea and vomiting 1960's: Pneumonia     Comment: history of No date: PONV (postoperative nausea and vomiting)   Reproductive/Obstetrics negative OB ROS                             Anesthesia Physical Anesthesia Plan  ASA: II  Anesthesia Plan: General   Post-op Pain Management:    Induction:   Airway Management Planned:   Additional Equipment:   Intra-op Plan:   Post-operative Plan:   Informed Consent: I have reviewed the patients History and Physical, chart, labs and  discussed the procedure including the risks, benefits and alternatives for the proposed anesthesia with the patient or authorized representative who has indicated his/her understanding and acceptance.   Dental Advisory Given  Plan Discussed with: Anesthesiologist, CRNA and Surgeon  Anesthesia Plan Comments:         Anesthesia Quick Evaluation

## 2017-02-15 NOTE — OR Nursing (Signed)
Patient states that he has had some relief of pain.  Currently a 6/10.  Nausea gone. 3 person transfer out of bed into wheelchair keeping left leg straight at all times. Pillow remains under knee. Ready to go!!

## 2017-02-15 NOTE — OR Nursing (Signed)
Nausea improved but pain has worsened. Able to take Tramadol(1) PO at 3:45 pm.  Telephone call to Dr. Roland Rack to request ability to give another Tramadol now and/or to provide Toradol IV to diminish nerve and muscle pain. Waiting for call back from surgeon.

## 2017-02-17 ENCOUNTER — Encounter: Payer: Self-pay | Admitting: Surgery

## 2017-02-25 ENCOUNTER — Other Ambulatory Visit: Payer: Self-pay | Admitting: Surgery

## 2017-02-25 ENCOUNTER — Ambulatory Visit
Admission: RE | Admit: 2017-02-25 | Discharge: 2017-02-25 | Disposition: A | Discharge status: Home / Self Care | Payer: Medicare Other | Source: Ambulatory Visit | Attending: Surgery | Admitting: Surgery

## 2017-02-25 DIAGNOSIS — I82890 Acute embolism and thrombosis of other specified veins: Secondary | ICD-10-CM | POA: Diagnosis not present

## 2017-02-25 DIAGNOSIS — S76012A Strain of muscle, fascia and tendon of left hip, initial encounter: Secondary | ICD-10-CM

## 2017-02-25 DIAGNOSIS — M7989 Other specified soft tissue disorders: Secondary | ICD-10-CM | POA: Diagnosis present

## 2017-11-28 ENCOUNTER — Other Ambulatory Visit: Payer: Self-pay | Admitting: Surgery

## 2017-11-28 DIAGNOSIS — M76892 Other specified enthesopathies of left lower limb, excluding foot: Secondary | ICD-10-CM

## 2017-12-09 ENCOUNTER — Ambulatory Visit
Admission: RE | Admit: 2017-12-09 | Discharge: 2017-12-09 | Disposition: A | Discharge status: Home / Self Care | Payer: Medicare Other | Source: Ambulatory Visit | Attending: Surgery | Admitting: Surgery

## 2017-12-09 DIAGNOSIS — R937 Abnormal findings on diagnostic imaging of other parts of musculoskeletal system: Secondary | ICD-10-CM | POA: Diagnosis not present

## 2017-12-09 DIAGNOSIS — M6258 Muscle wasting and atrophy, not elsewhere classified, other site: Secondary | ICD-10-CM | POA: Diagnosis not present

## 2017-12-09 DIAGNOSIS — K5730 Diverticulosis of large intestine without perforation or abscess without bleeding: Secondary | ICD-10-CM | POA: Diagnosis not present

## 2017-12-09 DIAGNOSIS — K4090 Unilateral inguinal hernia, without obstruction or gangrene, not specified as recurrent: Secondary | ICD-10-CM | POA: Insufficient documentation

## 2017-12-09 DIAGNOSIS — M76892 Other specified enthesopathies of left lower limb, excluding foot: Secondary | ICD-10-CM | POA: Insufficient documentation

## 2017-12-09 DIAGNOSIS — N400 Enlarged prostate without lower urinary tract symptoms: Secondary | ICD-10-CM | POA: Diagnosis not present

## 2018-02-17 ENCOUNTER — Ambulatory Visit
Admission: RE | Admit: 2018-02-17 | Discharge: 2018-02-17 | Disposition: A | Discharge status: Home / Self Care | Payer: Medicare Other | Source: Ambulatory Visit | Attending: Internal Medicine | Admitting: Internal Medicine

## 2018-02-17 ENCOUNTER — Other Ambulatory Visit: Payer: Self-pay | Admitting: Internal Medicine

## 2018-02-17 DIAGNOSIS — I803 Phlebitis and thrombophlebitis of lower extremities, unspecified: Secondary | ICD-10-CM | POA: Diagnosis present

## 2018-02-17 DIAGNOSIS — M79605 Pain in left leg: Secondary | ICD-10-CM

## 2018-02-17 DIAGNOSIS — M7989 Other specified soft tissue disorders: Secondary | ICD-10-CM | POA: Insufficient documentation

## 2018-02-17 DIAGNOSIS — M79662 Pain in left lower leg: Secondary | ICD-10-CM | POA: Diagnosis present

## 2018-04-05 ENCOUNTER — Ambulatory Visit: Admit: 2018-04-05 | Payer: Medicare Other | Admitting: Internal Medicine

## 2018-04-18 ENCOUNTER — Encounter: Payer: Self-pay | Admitting: *Deleted

## 2018-04-19 ENCOUNTER — Encounter
Admission: RE | Disposition: A | Discharge status: Home / Self Care | Payer: Self-pay | Source: Ambulatory Visit | Attending: Internal Medicine

## 2018-04-19 ENCOUNTER — Ambulatory Visit: Payer: Medicare Other | Admitting: Anesthesiology

## 2018-04-19 ENCOUNTER — Encounter: Payer: Self-pay | Admitting: *Deleted

## 2018-04-19 ENCOUNTER — Ambulatory Visit
Admission: RE | Admit: 2018-04-19 | Discharge: 2018-04-19 | Disposition: A | Discharge status: Home / Self Care | Payer: Medicare Other | Source: Ambulatory Visit | Attending: Internal Medicine | Admitting: Internal Medicine

## 2018-04-19 DIAGNOSIS — Z885 Allergy status to narcotic agent status: Secondary | ICD-10-CM | POA: Diagnosis not present

## 2018-04-19 DIAGNOSIS — Z8711 Personal history of peptic ulcer disease: Secondary | ICD-10-CM | POA: Insufficient documentation

## 2018-04-19 DIAGNOSIS — Z882 Allergy status to sulfonamides status: Secondary | ICD-10-CM | POA: Insufficient documentation

## 2018-04-19 DIAGNOSIS — D123 Benign neoplasm of transverse colon: Secondary | ICD-10-CM | POA: Diagnosis not present

## 2018-04-19 DIAGNOSIS — M1990 Unspecified osteoarthritis, unspecified site: Secondary | ICD-10-CM | POA: Insufficient documentation

## 2018-04-19 DIAGNOSIS — Z888 Allergy status to other drugs, medicaments and biological substances status: Secondary | ICD-10-CM | POA: Diagnosis not present

## 2018-04-19 DIAGNOSIS — K640 First degree hemorrhoids: Secondary | ICD-10-CM | POA: Insufficient documentation

## 2018-04-19 DIAGNOSIS — K5790 Diverticulosis of intestine, part unspecified, without perforation or abscess without bleeding: Secondary | ICD-10-CM | POA: Diagnosis not present

## 2018-04-19 DIAGNOSIS — Z1211 Encounter for screening for malignant neoplasm of colon: Secondary | ICD-10-CM | POA: Insufficient documentation

## 2018-04-19 DIAGNOSIS — K219 Gastro-esophageal reflux disease without esophagitis: Secondary | ICD-10-CM | POA: Insufficient documentation

## 2018-04-19 DIAGNOSIS — Z85828 Personal history of other malignant neoplasm of skin: Secondary | ICD-10-CM | POA: Diagnosis not present

## 2018-04-19 DIAGNOSIS — Z7951 Long term (current) use of inhaled steroids: Secondary | ICD-10-CM | POA: Insufficient documentation

## 2018-04-19 DIAGNOSIS — Z79899 Other long term (current) drug therapy: Secondary | ICD-10-CM | POA: Diagnosis not present

## 2018-04-19 DIAGNOSIS — Z86010 Personal history of colonic polyps: Secondary | ICD-10-CM | POA: Diagnosis not present

## 2018-04-19 HISTORY — PX: COLONOSCOPY WITH PROPOFOL: SHX5780

## 2018-04-19 SURGERY — COLONOSCOPY WITH PROPOFOL
Anesthesia: General

## 2018-04-19 MED ORDER — SODIUM CHLORIDE 0.9 % IV SOLN
INTRAVENOUS | Status: DC
  Administered 2018-04-19: 10:00:00 via INTRAVENOUS

## 2018-04-19 MED ORDER — LIDOCAINE HCL (CARDIAC) PF 100 MG/5ML IV SOSY
PREFILLED_SYRINGE | INTRAVENOUS | Status: DC | PRN
Start: 2018-04-19 — End: 2018-04-19
  Administered 2018-04-19: 80 mg via INTRAVENOUS

## 2018-04-19 MED ORDER — PROPOFOL 500 MG/50ML IV EMUL
INTRAVENOUS | Status: DC | PRN
  Administered 2018-04-19: 100 ug/kg/min via INTRAVENOUS

## 2018-04-19 MED ORDER — PROPOFOL 10 MG/ML IV BOLUS
INTRAVENOUS | Status: DC | PRN
  Administered 2018-04-19: 30 mg via INTRAVENOUS
  Administered 2018-04-19: 70 mg via INTRAVENOUS

## 2018-04-19 MED ORDER — ONDANSETRON HCL 4 MG/2ML IJ SOLN
INTRAMUSCULAR | Status: DC | PRN
  Administered 2018-04-19: 4 mg via INTRAVENOUS

## 2018-04-19 MED ORDER — PROPOFOL 10 MG/ML IV BOLUS
INTRAVENOUS | Status: AC
  Filled 2018-04-19: qty 40

## 2018-04-19 NOTE — Anesthesia Post-op Follow-up Note (Signed)
Anesthesia QCDR form completed.        

## 2018-04-19 NOTE — Anesthesia Postprocedure Evaluation (Signed)
Anesthesia Post Note  Patient: Keith Gordon  Procedure(s) Performed: COLONOSCOPY WITH PROPOFOL (N/A )  Patient location during evaluation: Endoscopy Anesthesia Type: General Level of consciousness: awake and alert Pain management: pain level controlled Vital Signs Assessment: post-procedure vital signs reviewed and stable Respiratory status: spontaneous breathing, nonlabored ventilation, respiratory function stable and patient connected to nasal cannula oxygen Cardiovascular status: blood pressure returned to baseline and stable Postop Assessment: no apparent nausea or vomiting Anesthetic complications: no     Last Vitals:  Vitals:   04/19/18 1150 04/19/18 1200  BP: (!) 119/58 111/71  Pulse:    Resp:    Temp:    SpO2:      Last Pain:  Vitals:   04/19/18 1134  TempSrc: Tympanic  PainSc:                  Martha Clan

## 2018-04-19 NOTE — Interval H&P Note (Signed)
History and Physical Interval Note:  04/19/2018 10:53 AM  Keith Gordon  has presented today for surgery, with the diagnosis of PH polyps  The various methods of treatment have been discussed with the patient and family. After consideration of risks, benefits and other options for treatment, the patient has consented to  Procedure(s): COLONOSCOPY WITH PROPOFOL (N/A) as a surgical intervention .  The patient's history has been reviewed, patient examined, no change in status, stable for surgery.  I have reviewed the patient's chart and labs.  Questions were answered to the patient's satisfaction.     [place], [place]

## 2018-04-19 NOTE — Transfer of Care (Signed)
Immediate Anesthesia Transfer of Care Note  Patient: Keith Gordon  Procedure(s) Performed: COLONOSCOPY WITH PROPOFOL (N/A )  Patient Location: PACU  Anesthesia Type:General  Level of Consciousness: awake, alert  and oriented  Airway & Oxygen Therapy: Patient Spontanous Breathing and Patient connected to nasal cannula oxygen  Post-op Assessment: Report given to RN and Post -op Vital signs reviewed and stable  Post vital signs: Reviewed and stable  Last Vitals:  Vitals Value Taken Time  BP    Temp    Pulse    Resp    SpO2      Last Pain:  Vitals:   04/19/18 0935  TempSrc: Tympanic  PainSc: 0-No pain         Complications: No apparent anesthesia complications

## 2018-04-19 NOTE — Op Note (Signed)
[facility] Gastroenterology Patient Name: Keith Gordon Procedure Date: 04/19/2018 10:46 AM MRN: [id number] Account #: [id number] Date of Birth: 1941-03-26 Admit Type: Outpatient Age: 77 Room: [facility] ENDO ROOM 4 Gender: Male Note Status: Finalized Procedure:            Colonoscopy Indications:          High risk colon cancer surveillance: Personal history                        of colonic polyps Providers:            Benay Pike. Toledo MD, MD Medicines:            Propofol per Anesthesia Complications:        No immediate complications. Procedure:            Pre-Anesthesia Assessment:                       - The risks and benefits of the procedure and the                        sedation options and risks were discussed with the                        patient. All questions were answered and informed                        consent was obtained.                       - Patient identification and proposed procedure were                        verified prior to the procedure by the nurse. The                        procedure was verified in the procedure room.                       - ASA Grade Assessment: III - A patient with severe                        systemic disease.                       - After reviewing the risks and benefits, the patient                        was deemed in satisfactory condition to undergo the                        procedure.                       After obtaining informed consent, the colonoscope was                        passed under direct vision. Throughout the procedure,                        the patient's blood pressure, pulse, and oxygen  saturations were monitored continuously. The                        Colonoscope was introduced through the anus and                        advanced to the the cecum, identified by appendiceal                        orifice and ileocecal valve. The colonoscopy was                performed without difficulty. The patient tolerated the                        procedure well. The quality of the bowel preparation                        was adequate. Findings:      The perianal and digital rectal examinations were normal. Pertinent       negatives include normal sphincter tone and no palpable rectal lesions.      Many small and large-mouthed diverticula were found in the left colon.      A 3 mm polyp was found in the transverse colon. The polyp was sessile.       The polyp was removed with a jumbo cold forceps. Resection and retrieval       were complete.      Non-bleeding internal hemorrhoids were found during retroflexion. The       hemorrhoids were Grade I (internal hemorrhoids that do not prolapse).      The exam was otherwise without abnormality. Impression:           - Diverticulosis in the left colon.                       - One 3 mm polyp in the transverse colon, removed with                        a jumbo cold forceps. Resected and retrieved.                       - Non-bleeding internal hemorrhoids.                       - The examination was otherwise normal. Recommendation:       - Patient has a contact number available for                        emergencies. The signs and symptoms of potential                        delayed complications were discussed with the patient.                        Return to normal activities tomorrow. Written discharge                        instructions were provided to the patient.                       - Resume previous diet.                       -  No recommendation at this time regarding repeat                        colonoscopy due to age.                       - Return to GI office PRN.                       - The findings and recommendations were discussed with                        the patient and their spouse. Procedure Code(s):    --- Professional ---                       [id number], Colonoscopy,  flexible; with biopsy, single or                        multiple Diagnosis Code(s):    --- Professional ---                       Z86.010, Personal history of colonic polyps                       K64.0, First degree hemorrhoids                       D12.3, Benign neoplasm of transverse colon (hepatic                        flexure or splenic flexure)                       K57.30, Diverticulosis of large intestine without                        perforation or abscess without bleeding CPT copyright 2017 American Medical Association. All rights reserved. The codes documented in this report are preliminary and upon coder review may  be revised to meet current compliance requirements. Efrain Sella MD, MD 04/19/2018 11:29:39 AM This report has been signed electronically. Number of Addenda: 0 Note Initiated On: 04/19/2018 10:46 AM Scope Withdrawal Time: 0 hours 8 minutes 28 seconds  Total Procedure Duration: 0 hours 15 minutes 3 seconds       [facility]

## 2018-04-19 NOTE — Anesthesia Preprocedure Evaluation (Signed)
Anesthesia Evaluation  Patient identified by MRN, date of birth, ID band Patient awake    Reviewed: Allergy & Precautions, H&P , NPO status , Patient's Chart, lab work & pertinent test results, reviewed documented beta blocker date and time   History of Anesthesia Complications (+) PONV, Family history of anesthesia reaction and history of anesthetic complications  Airway Mallampati: II  TM Distance: >3 FB Neck ROM: full    Dental  (+) Caps, Missing, Poor Dentition   Pulmonary neg shortness of breath, asthma , neg sleep apnea, COPD,  COPD inhaler, neg recent URI, former smoker,           Cardiovascular Exercise Tolerance: Good (-) hypertension(-) angina(-) CAD, (-) Past MI, (-) Cardiac Stents and (-) CABG + dysrhythmias (-) Valvular Problems/Murmurs     Neuro/Psych negative neurological ROS  negative psych ROS   GI/Hepatic Neg liver ROS, PUD, GERD  ,  Endo/Other  negative endocrine ROS  Renal/GU negative Renal ROS  negative genitourinary   Musculoskeletal   Abdominal   Peds  Hematology negative hematology ROS (+)   Anesthesia Other Findings Past Medical History: No date: Arthritis No date: Asthma     Comment: as a child 69: Bleeding ulcer 2008: Cancer ([place])     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 No date: Dysrhythmia No date: Family history of adverse reaction to anesthes*     Comment: son severe nausea and vomiting 1960's: Pneumonia     Comment: history of No date: PONV (postoperative nausea and vomiting)   Reproductive/Obstetrics negative OB ROS                             Anesthesia Physical  Anesthesia Plan  ASA: II  Anesthesia Plan: General   Post-op Pain Management:    Induction: Intravenous  PONV Risk Score and Plan: 3 and Propofol infusion  Airway Management Planned: Nasal Cannula  Additional Equipment:   Intra-op Plan:   Post-operative Plan:   Informed  Consent: I have reviewed the patients History and Physical, chart, labs and discussed the procedure including the risks, benefits and alternatives for the proposed anesthesia with the patient or authorized representative who has indicated his/her understanding and acceptance.   Dental Advisory Given  Plan Discussed with: Anesthesiologist, CRNA and Surgeon  Anesthesia Plan Comments:         Anesthesia Quick Evaluation

## 2018-04-19 NOTE — H&P (Signed)
Outpatient short stay form Pre-procedure 04/19/2018 10:51 AM Keith Gordon K. Alice Reichert, M.D.  Primary Physician: Tracie Harrier, M.D.  Reason for visit:  Personal hx of colon polyps.  History of present illness:  Patient presents for colonoscopy for colon polyp surveillance. The patient denies complaints of abdominal pain, significant change in bowel habits, or rectal bleeding.     Current Facility-Administered Medications:  .  0.9 %  sodium chloride infusion, , Intravenous, Continuous, Dutton, Benay Pike, MD, Last Rate: 20 mL/hr at 04/19/18 1047  Medications Prior to Admission  Medication Sig Dispense Refill Last Dose  . FLOVENT HFA 220 MCG/ACT inhaler Inhale 2 puffs into the lungs 2 (two) times daily as needed for shortness of breath.   Past Month at Unknown time  . ibuprofen (ADVIL,MOTRIN) 200 MG tablet Take 200 mg by mouth at bedtime as needed.   Past Week at Unknown time  . PROAIR HFA 108 (90 Base) MCG/ACT inhaler Inhale 2 puffs into the lungs every 6 (six) hours as needed for shortness of breath.    Past Month at Unknown time  . ondansetron (ZOFRAN ODT) 4 MG disintegrating tablet Take 1-2 tablets (4-8 mg total) by mouth every 8 (eight) hours as needed for nausea or vomiting. (Patient not taking: Reported on 04/19/2018) 20 tablet 2 Not Taking at Unknown time  . traMADol (ULTRAM) 50 MG tablet Take 1-2 tablets (50-100 mg total) by mouth every 6 (six) hours as needed. (Patient not taking: Reported on 04/19/2018) 40 tablet 0 Not Taking at Unknown time     Allergies  Allergen Reactions  . Demerol [Meperidine] Nausea And Vomiting  . Levofloxacin Other (See Comments)    Spots on legs   . Sulfa Antibiotics Rash     Past Medical History:  Diagnosis Date  . Arthritis   . Asthma    as a child  . Bleeding ulcer 1982  . 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 Dysrhythmia   . Family history of adverse reaction to anesthesia    son severe nausea and vomiting  . Pneumonia 1960's   history of  . PONV (postoperative nausea and vomiting)     Review of systems:  Otherwise negative.    Physical Exam  Gen: Alert, oriented. Appears stated age.  HEENT: [place]/AT. PERRLA. Lungs: CTA, no wheezes. CV: RR nl S1, S2. Abd: soft, benign, no masses. BS+ Ext: No edema. Pulses 2+    Planned procedures: Proceed with colonoscopy. The patient understands the nature of the planned procedure, indications, risks, alternatives and potential complications including but not limited to bleeding, infection, perforation, damage to internal organs and possible oversedation/side effects from anesthesia. The patient agrees and gives consent to proceed.  Please refer to procedure notes for findings, recommendations and patient disposition/instructions.     Tanna Loeffler K. Alice Reichert, M.D. Gastroenterology 04/19/2018  10:51 AM

## 2018-04-20 LAB — SURGICAL PATHOLOGY

## 2018-04-21 ENCOUNTER — Encounter: Payer: Self-pay | Admitting: Internal Medicine

## 2018-05-26 ENCOUNTER — Other Ambulatory Visit: Payer: Self-pay | Admitting: Ophthalmology

## 2018-05-26 DIAGNOSIS — G43519 Persistent migraine aura without cerebral infarction, intractable, without status migrainosus: Secondary | ICD-10-CM

## 2018-06-08 ENCOUNTER — Ambulatory Visit
Admission: RE | Admit: 2018-06-08 | Discharge: 2018-06-08 | Disposition: A | Discharge status: Home / Self Care | Payer: Medicare Other | Source: Ambulatory Visit | Attending: Ophthalmology | Admitting: Ophthalmology

## 2018-06-08 DIAGNOSIS — G43519 Persistent migraine aura without cerebral infarction, intractable, without status migrainosus: Secondary | ICD-10-CM | POA: Diagnosis not present

## 2018-06-08 DIAGNOSIS — K521 Toxic gastroenteritis and colitis: Secondary | ICD-10-CM | POA: Insufficient documentation

## 2018-06-08 DIAGNOSIS — Z8619 Personal history of other infectious and parasitic diseases: Secondary | ICD-10-CM | POA: Insufficient documentation

## 2018-06-08 DIAGNOSIS — R110 Nausea: Secondary | ICD-10-CM | POA: Insufficient documentation

## 2018-06-08 DIAGNOSIS — R1013 Epigastric pain: Secondary | ICD-10-CM | POA: Insufficient documentation

## 2018-06-08 LAB — POCT I-STAT CREATININE: CREATININE: 1.3 mg/dL — AB (ref 0.61–1.24)

## 2018-06-08 MED ORDER — GADOBENATE DIMEGLUMINE 529 MG/ML IV SOLN
20.0000 mL | Freq: Once | INTRAVENOUS | Status: AC | PRN
  Administered 2018-06-08: 20 mL via INTRAVENOUS

## 2018-06-09 ENCOUNTER — Other Ambulatory Visit: Payer: Self-pay | Admitting: Internal Medicine

## 2018-06-09 DIAGNOSIS — R1013 Epigastric pain: Secondary | ICD-10-CM

## 2018-06-14 ENCOUNTER — Ambulatory Visit
Admission: RE | Admit: 2018-06-14 | Discharge: 2018-06-14 | Disposition: A | Discharge status: Home / Self Care | Payer: Medicare Other | Source: Ambulatory Visit | Attending: Internal Medicine | Admitting: Internal Medicine

## 2018-06-14 DIAGNOSIS — K449 Diaphragmatic hernia without obstruction or gangrene: Secondary | ICD-10-CM | POA: Diagnosis not present

## 2018-06-14 DIAGNOSIS — R1013 Epigastric pain: Secondary | ICD-10-CM | POA: Diagnosis present

## 2018-07-19 DIAGNOSIS — G43109 Migraine with aura, not intractable, without status migrainosus: Secondary | ICD-10-CM | POA: Insufficient documentation

## 2018-07-19 DIAGNOSIS — R200 Anesthesia of skin: Secondary | ICD-10-CM | POA: Insufficient documentation

## 2018-07-19 DIAGNOSIS — H539 Unspecified visual disturbance: Secondary | ICD-10-CM | POA: Insufficient documentation

## 2018-07-19 DIAGNOSIS — R202 Paresthesia of skin: Secondary | ICD-10-CM

## 2019-01-24 ENCOUNTER — Ambulatory Visit: Payer: Self-pay | Admitting: Urology

## 2019-02-05 ENCOUNTER — Encounter: Payer: Self-pay | Admitting: Surgery

## 2019-02-21 ENCOUNTER — Ambulatory Visit: Payer: Medicare Other | Admitting: Podiatry

## 2019-02-21 ENCOUNTER — Encounter: Payer: Self-pay | Admitting: Podiatry

## 2019-02-21 ENCOUNTER — Other Ambulatory Visit: Payer: Self-pay

## 2019-02-21 VITALS — Temp 98.2°F

## 2019-02-21 DIAGNOSIS — K5792 Diverticulitis of intestine, part unspecified, without perforation or abscess without bleeding: Secondary | ICD-10-CM | POA: Insufficient documentation

## 2019-02-21 DIAGNOSIS — L02612 Cutaneous abscess of left foot: Secondary | ICD-10-CM | POA: Diagnosis not present

## 2019-02-21 DIAGNOSIS — K279 Peptic ulcer, site unspecified, unspecified as acute or chronic, without hemorrhage or perforation: Secondary | ICD-10-CM | POA: Insufficient documentation

## 2019-02-21 DIAGNOSIS — M7070 Other bursitis of hip, unspecified hip: Secondary | ICD-10-CM | POA: Insufficient documentation

## 2019-02-21 DIAGNOSIS — Z86010 Personal history of colonic polyps: Secondary | ICD-10-CM | POA: Insufficient documentation

## 2019-02-21 NOTE — Progress Notes (Signed)
  Subjective:  Patient ID: Keith Gordon, male    DOB: December 15, 1940,  MRN: [id number] HPI Chief Complaint  Patient presents with  . Nail Problem    Hallux left - toenail deformity x years, tried trimming it last week and made it bleed, tender  . New Patient (Initial Visit)    Est pt 61    78 y.o. male presents with the above complaint.   ROS: He denies fever chills nausea vomiting muscle aches pains calf pain back pain chest pain shortness of breath.  Past Medical History:  Diagnosis Date  . Arthritis   . Asthma    as a child  . Bleeding ulcer 1982  . 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 Dysrhythmia   . Family history of adverse reaction to anesthesia    son severe nausea and vomiting  . Pneumonia 1960's   history of  . PONV (postoperative nausea and vomiting)    Past Surgical History:  Procedure Laterality Date  . APPENDECTOMY  1968  . CHOLECYSTECTOMY  2001  . COLONOSCOPY WITH PROPOFOL N/A 04/19/2018   Procedure: COLONOSCOPY WITH PROPOFOL;  Surgeon: Toledo, Benay Pike, MD;  Location: [facility] ENDOSCOPY;  Service: Endoscopy;  Laterality: N/A;  . EYE SURGERY Right 2001   tear duct  . OPEN SURGICAL REPAIR OF GLUTEAL TENDON Left 02/15/2017   Procedure: PRIMARY REPAIR OF THE GLUTEUS MEDIUS TENDON OF HIP;  Surgeon: Corky Mull, MD;  Location: [facility] ORS;  Service: Orthopedics;  Laterality: Left;  . [place]  . TONSILLECTOMY  1951    Current Outpatient Medications:  .  ibuprofen (ADVIL,MOTRIN) 200 MG tablet, Take 200 mg by mouth at bedtime as needed., Disp: , Rfl:  .  vitamin B-12 (CYANOCOBALAMIN) 1000 MCG tablet, Take by mouth., Disp: , Rfl:   Allergies  Allergen Reactions  . Demerol [Meperidine] Nausea And Vomiting  . Dust Mite Extract Other (See Comments)  . Levofloxacin Other (See Comments)    Spots on legs   . Sulfa Antibiotics Rash   Review of Systems Objective:   Vitals:   02/21/19 1531  Temp: 98.2 F (36.8 C)     General: Well developed, nourished, in no acute distress, alert and oriented x3   Dermatological: Skin is warm, dry and supple bilateral. Nails x 10 are well maintained; remaining integument appears unremarkable at this time. There are no open sores, no preulcerative lesions, no rash or signs of infection present.  Vascular: Dorsalis Pedis artery and Posterior Tibial artery pedal pulses are 2/4 bilateral with immedate capillary fill time. Pedal hair growth present. No varicosities and no lower extremity edema present bilateral.   Neruologic: Grossly intact via light touch bilateral. Vibratory intact via tuning fork bilateral. Protective threshold with Semmes Wienstein monofilament intact to all pedal sites bilateral. Patellar and Achilles deep tendon reflexes 2+ bilateral. No Babinski or clonus noted bilateral.   Musculoskeletal: No gross boney pedal deformities bilateral. No pain, crepitus, or limitation noted with foot and ankle range of motion bilateral. Muscular strength 5/5 in all groups tested bilateral.  Gait: Unassisted, Nonantalgic.    Radiographs:  None taken  Assessment & Plan:   Assessment: Subungual abscess hallux left  Plan: Debrided hallux nail plate removed small granuloma placed silver nitrate and dressed a compressive dressing instructed soaking x1 week Epsom salts and warm water follow-up with me as needed.     Quin Mathenia T. Baraga, [place]

## 2019-04-10 ENCOUNTER — Other Ambulatory Visit: Payer: Self-pay

## 2019-04-10 ENCOUNTER — Encounter: Payer: Self-pay | Admitting: Urology

## 2019-04-10 ENCOUNTER — Ambulatory Visit: Payer: Medicare Other | Admitting: Urology

## 2019-04-10 VITALS — BP 120/64 | HR 66 | Ht 74.0 in | Wt 215.0 lb

## 2019-04-10 DIAGNOSIS — N402 Nodular prostate without lower urinary tract symptoms: Secondary | ICD-10-CM

## 2019-04-10 NOTE — Progress Notes (Signed)
04/10/2019 8:46 AM   Sanda Klein [birth year] [id number]  Referring provider: Tracie Harrier, [place],  [place] [address]  CC: Prostate nodule  HPI: I saw Mr. Lupe in urology clinic today to establish care for chronic prostate nodule.  He was previously followed long-term by Dr. Jacqlyn Larsen in [place] and at [facility].  He is a very healthy 78 year old male with a known prostate nodule since at least 2014, and normal PSAs.  PSA was last checked in February 2018, and was stable at 2.2.  He has never undergone prostate biopsy.  He reports a family history of prostate cancer in his uncles, but he does not know the age of diagnosis or severity.  He does not take any medications regularly.  His past surgical his history is notable for a laparoscopic cholecystectomy and appendectomy.  He denies any urinary symptoms aside from nocturia x1.  There are no aggravating or alleviating factors.  Severity is mild.  He denies any gross hematuria, flank pain, weight loss, bone pain, or difficulty urinating.  He denies any history of urinary tract infections.  His wife is undergoing treatment for stage IV small cell lung cancer with metastatic disease to the brain and spine, and he spends most of his time helping care for her.   PMH: Past Medical History:  Diagnosis Date  . Arthritis   . Asthma    as a child  . Bleeding ulcer 1982  . 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 Dysrhythmia   . Family history of adverse reaction to anesthesia    son severe nausea and vomiting  . Pneumonia 1960's   history of  . PONV (postoperative nausea and vomiting)     Surgical History: Past Surgical History:  Procedure Laterality Date  . APPENDECTOMY  1968  . CHOLECYSTECTOMY  2001  . COLONOSCOPY WITH PROPOFOL N/A 04/19/2018   Procedure: COLONOSCOPY WITH PROPOFOL;  Surgeon: Toledo, Benay Pike, MD;  Location: [facility] ENDOSCOPY;  Service: Endoscopy;  Laterality:  N/A;  . EYE SURGERY Right 2001   tear duct  . OPEN SURGICAL REPAIR OF GLUTEAL TENDON Left 02/15/2017   Procedure: PRIMARY REPAIR OF THE GLUTEUS MEDIUS TENDON OF HIP;  Surgeon: Corky Mull, MD;  Location: [facility] ORS;  Service: Orthopedics;  Laterality: Left;  . [place]  . TONSILLECTOMY  1951    Allergies:  Allergies  Allergen Reactions  . Demerol [Meperidine] Nausea And Vomiting  . Dust Mite Extract Other (See Comments)  . Levofloxacin Other (See Comments)    Spots on legs   . Sulfa Antibiotics Rash    Family History: Family history of prostate cancer in multiple uncles  Social History:  reports that he quit smoking about 54 years ago. He has never used smokeless tobacco. He reports that he does not drink alcohol or use drugs.  ROS: Please see flowsheet from today's date for complete review of systems.  Physical Exam: BP 120/64   Pulse 66   Ht 6\' 2"  (1.88 m)   Wt 215 lb (97.5 kg)   BMI 27.60 kg/m    Constitutional:  Alert and oriented, No acute distress. Cardiovascular: No clubbing, cyanosis, or edema. Respiratory: Normal respiratory effort, no increased work of breathing. GI: Abdomen is soft, nontender, nondistended, no abdominal masses GU: No CVA tenderness DRE: 40 g, smooth, mobile prostatic nodule at apex  Lymph: No cervical or inguinal lymphadenopathy. Skin: No rashes,  bruises or suspicious lesions. Neurologic: Grossly intact, no focal deficits, moving all 4 extremities. Psychiatric: Normal mood and affect.  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 Jacqlyn Larsen.  He has never undergone prostate biopsy before.  We reviewed the AUA guidelines that recommend discontinuing PSA screening after age 57.  We discussed the very  low, but not 0, risk of prostate malignancy, and risks and benefits of undergoing a prostate biopsy.  With his age, normal PSA, and chronic nature of his DRE findings he does not want to undergo prostate biopsy at this time.  I agree this is very reasonable, and I am happy to see him again in the future if new problems arise.  He would like to follow-up on an as-needed basis   Billey Co, MD  [facility] [address] [place], [place] [address] [phone]

## 2019-04-10 NOTE — Patient Instructions (Signed)

## 2019-04-11 ENCOUNTER — Ambulatory Visit: Payer: Self-pay | Admitting: Urology

## 2019-04-25 IMAGING — MR MR HIP*L* W/O CM
4 of 5 series · 28 of 40 positions shown · non-contrast
Comparison: MR Ramel hip 07/19/2016.

CLINICAL DATA: Left hip pain with walking. Patient status post left
gluteus medius repair 02/15/2017.

EXAM:
MR OF THE LEFT HIP WITHOUT CONTRAST
TECHNIQUE: Multiplanar, multisequence MR imaging was performed. No intravenous
contrast was administered.

[Series 4: T1 · coronal · 4.0mm · 0.89mm/px · 9 of 37 slices shown (1 of 2)]
[im 1/37]
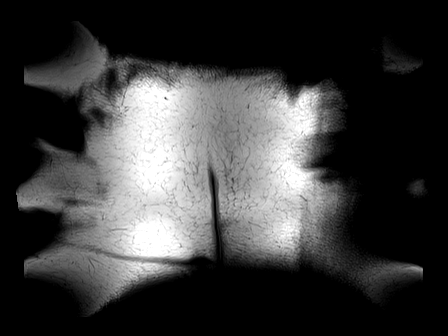
[im 5/37]
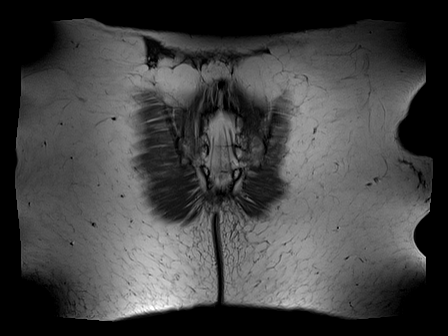
[im 10/37]
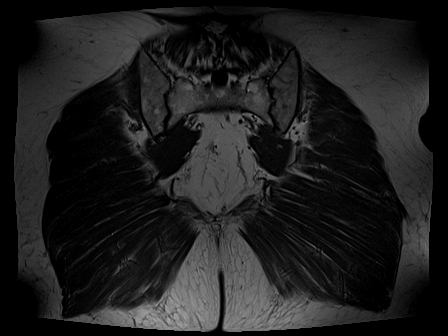
[im 14/37]
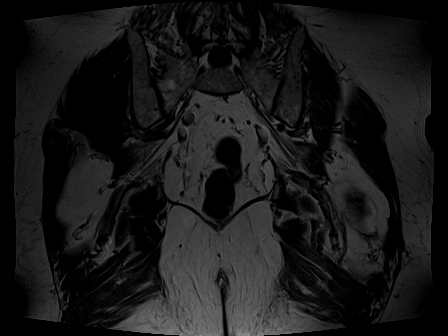
[im 19/37]
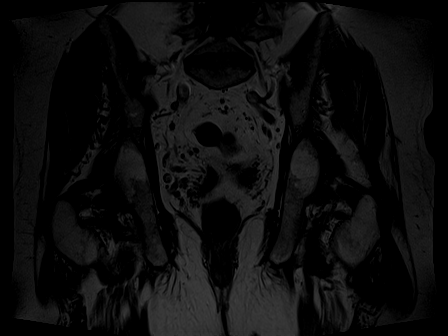
[im 23/37]
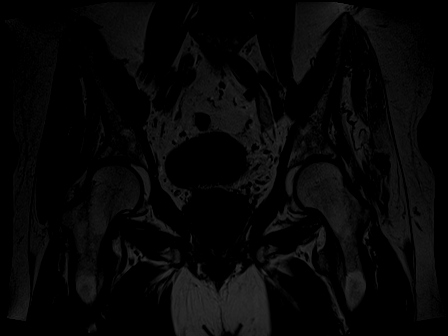
[im 28/37]
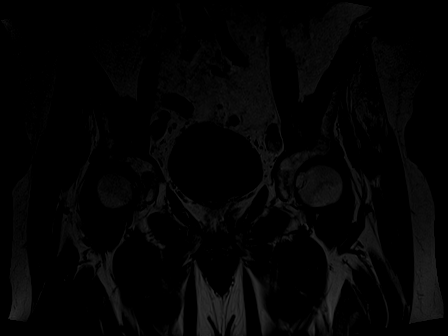
[im 32/37]
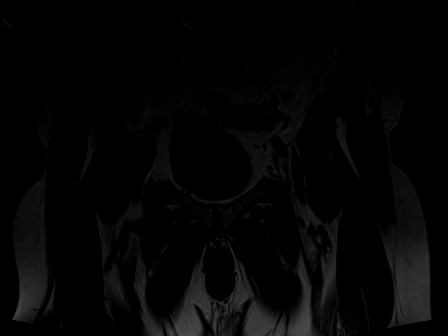
[im 37/37]
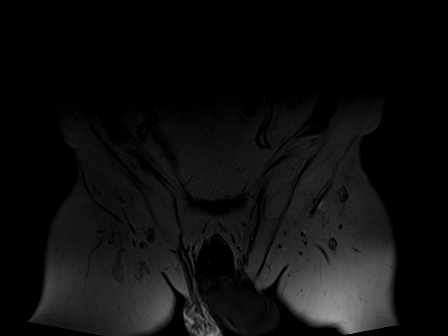

[Series 5: T1 · axial · 4.5mm · 0.85mm/px · z∈[-38,+136]mm · 4 of 37 slices shown (2 of 2)]
[im 1/37]
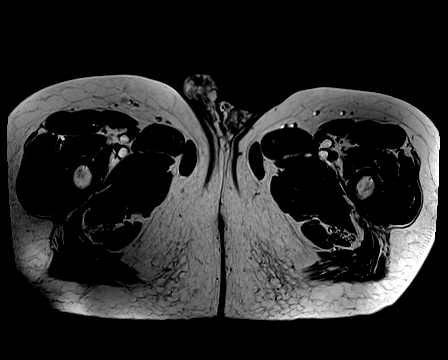
[im 5/37]
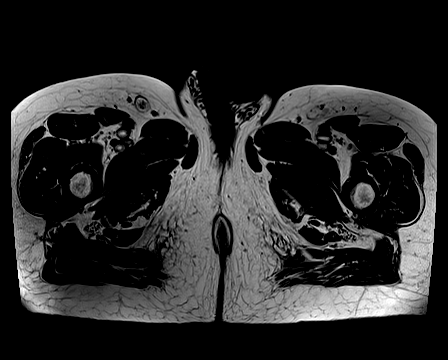
[im 19/37]
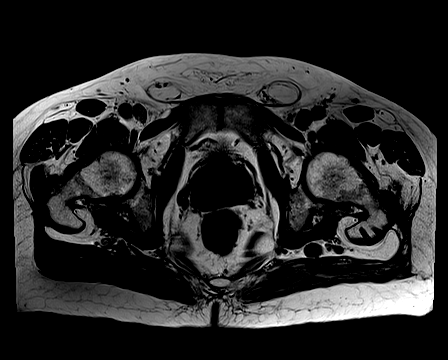
[im 32/37]
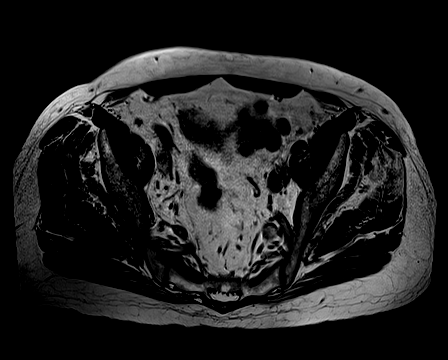

[Series 6: T2 fat-sat · axial · 4.5mm · 0.85mm/px · z∈[-38,+164]mm · 9 of 37 slices shown]
[im 1/37]
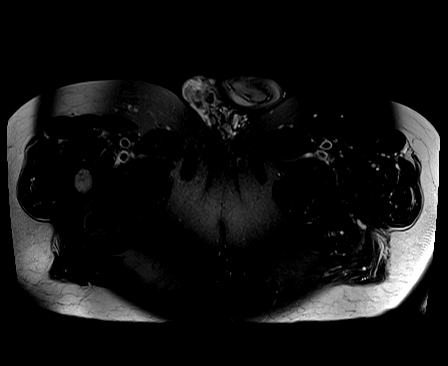
[im 5/37]
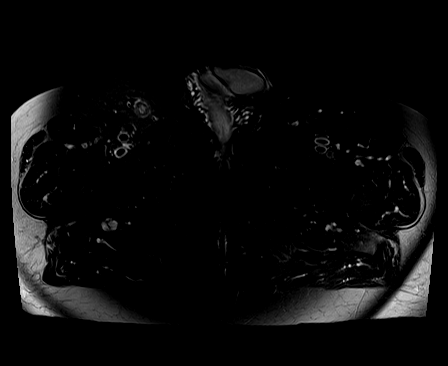
[im 10/37]
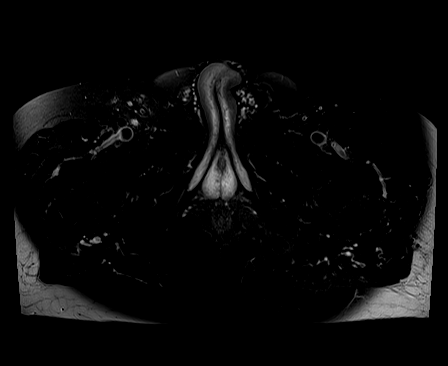
[im 14/37]
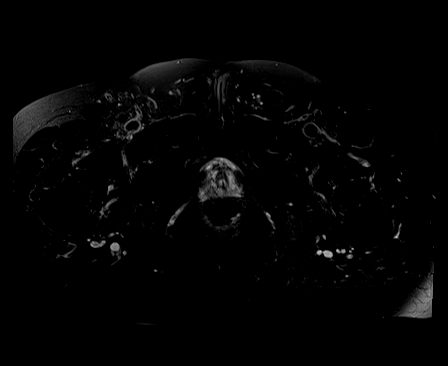
[im 19/37]
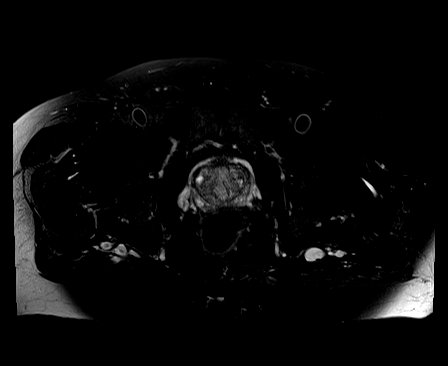
[im 23/37]
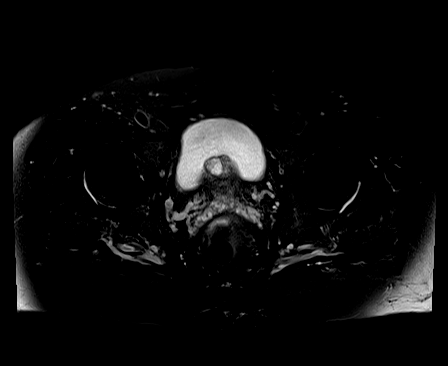
[im 28/37]
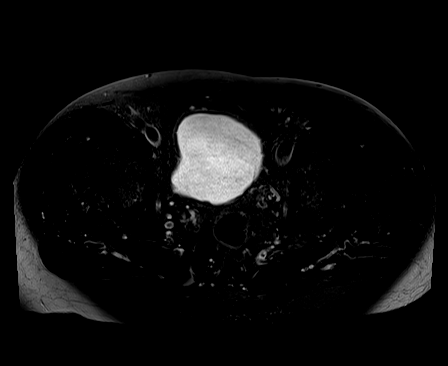
[im 32/37]
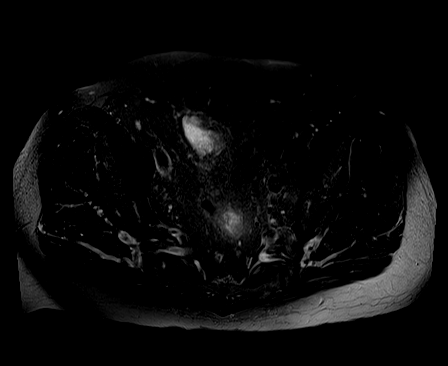
[im 37/37]
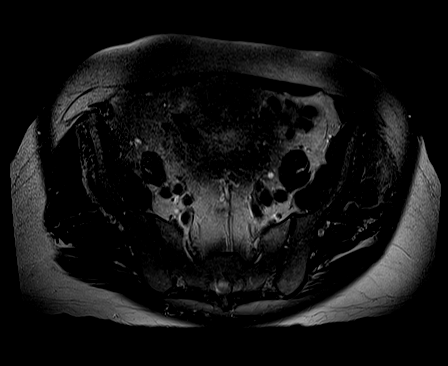

[Series 7: PD fat-sat · sagittal · 4.5mm · 0.29mm/px · 6 of 26 slices shown]
[im 1/26]
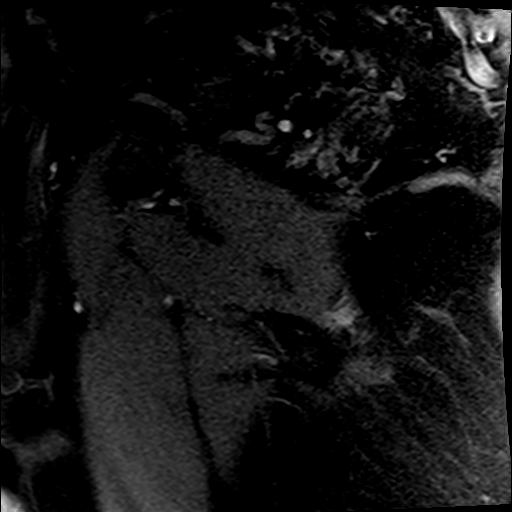
[im 6/26]
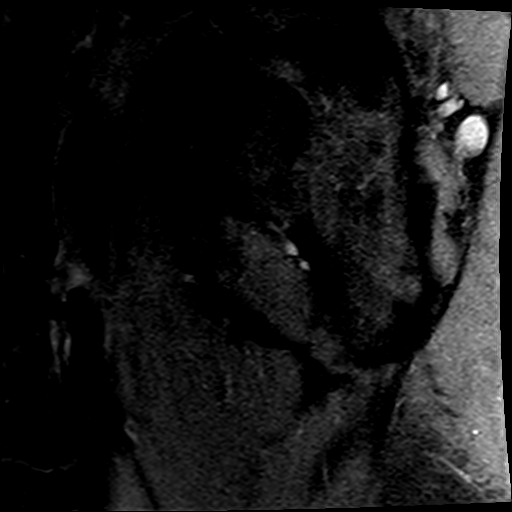
[im 11/26]
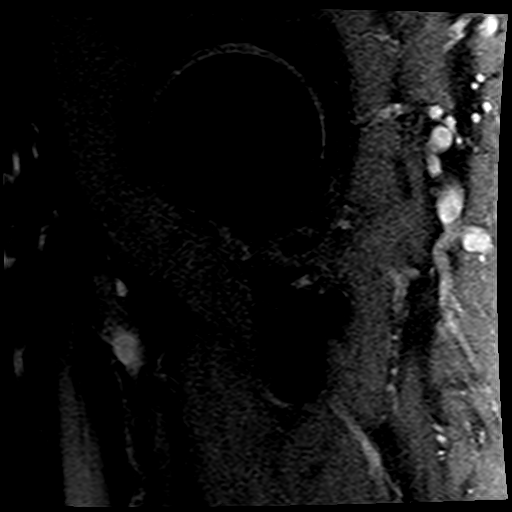
[im 16/26]
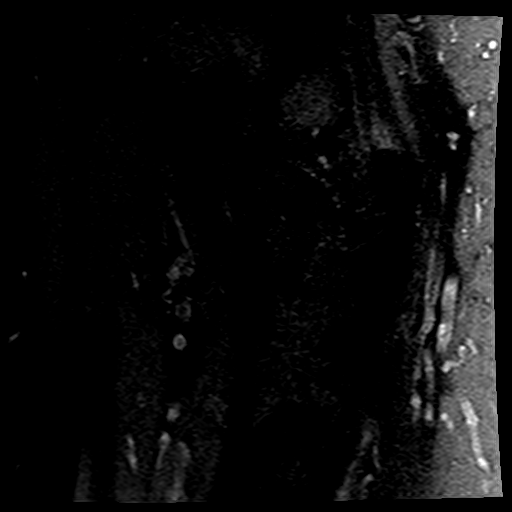
[im 21/26]
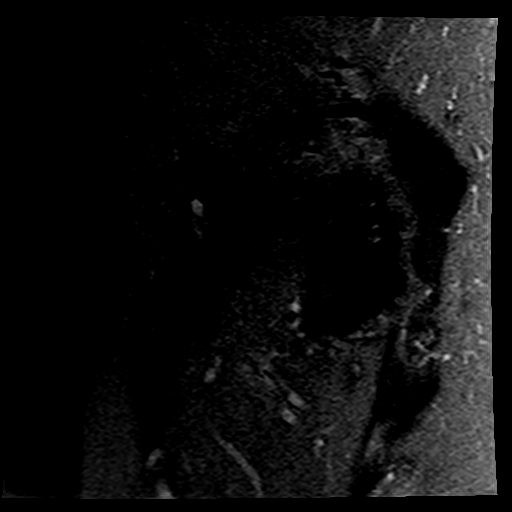
[im 26/26]
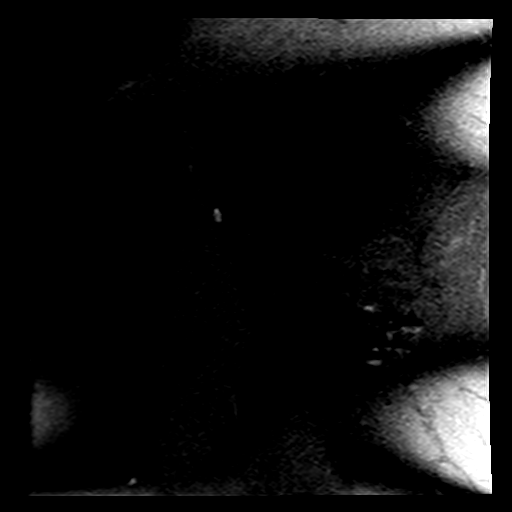

[28 of 40 positions shown; findings below may reference images not displayed]

FINDINGS: Bones: No fracture, stress change or worrisome lesion. No avascular
necrosis of the femoral heads. No subchondral edema or cyst
formation about the hips.

Articular cartilage and labrum

Articular cartilage:  Preserved.

Labrum:  Appears intact.

Joint or bursal effusion

Joint effusion:  None.

Bursae: Fluid is seen along the left greater trochanter measuring
approximately 1.3 cm AP x 0.7 cm transverse x 2.7 cm craniocaudal.

Muscles and tendons

Muscles and tendons: Tracks in the left greater trochanter for
gluteus medius tendon repair are identified. The tendon is intact.
There is new fatty atrophy of approximately the anterior 40% aspect
of the distal gluteus medius muscle belly. Musculature about the
pelvis is otherwise normal in appearance.

Other findings

Miscellaneous: Sigmoid diverticulosis without diverticulitis. Fat
containing inguinal hernias are noted. There is prostatomegaly.
IMPRESSION: Status post left gluteus medius tendon repair. The tendon is intact.
There is new fatty atrophy of approximately the anterior 40% of the
gluteus medius muscle belly.

Small volume of fluid along the left greater trochanter could be due
to bursitis or may be a seroma related to prior surgery.

Prostatomegaly.

Sigmoid diverticulosis without diverticulitis.

Fat containing inguinal hernias.

## 2019-07-31 DIAGNOSIS — K219 Gastro-esophageal reflux disease without esophagitis: Secondary | ICD-10-CM | POA: Insufficient documentation

## 2019-12-28 ENCOUNTER — Ambulatory Visit: Payer: Medicare Other | Attending: Internal Medicine

## 2019-12-28 DIAGNOSIS — Z23 Encounter for immunization: Secondary | ICD-10-CM | POA: Insufficient documentation

## 2019-12-28 NOTE — Progress Notes (Signed)
   Covid-19 Vaccination Clinic  Name:  Keith Gordon    MRN: [id number] DOB: Apr 22, 1941  12/28/2019  [name] was observed post Covid-19 immunization for 15 minutes without incident. He was provided with Vaccine Information Sheet and instruction to access the V-Safe system.   [name] was instructed to call 911 with any severe reactions post vaccine: Marland Kitchen Difficulty breathing  . Swelling of face and throat  . A fast heartbeat  . A bad rash all over body  . Dizziness and weakness   Immunizations Administered    Name Date Dose VIS Date Route   Pfizer COVID-19 Vaccine 12/28/2019  9:06 AM 0.3 mL 10/05/2019 Intramuscular   Manufacturer: [place]   Lot: [id number]   [place]: [id number]

## 2020-01-09 ENCOUNTER — Other Ambulatory Visit: Payer: Self-pay | Admitting: Orthopedic Surgery

## 2020-01-09 DIAGNOSIS — M542 Cervicalgia: Secondary | ICD-10-CM

## 2020-01-09 DIAGNOSIS — M5030 Other cervical disc degeneration, unspecified cervical region: Secondary | ICD-10-CM

## 2020-01-16 ENCOUNTER — Ambulatory Visit
Admission: RE | Admit: 2020-01-16 | Discharge: 2020-01-16 | Disposition: A | Discharge status: Home / Self Care | Payer: Medicare Other | Source: Ambulatory Visit | Attending: Orthopedic Surgery | Admitting: Orthopedic Surgery

## 2020-01-16 ENCOUNTER — Other Ambulatory Visit: Payer: Self-pay

## 2020-01-16 DIAGNOSIS — M5030 Other cervical disc degeneration, unspecified cervical region: Secondary | ICD-10-CM | POA: Diagnosis present

## 2020-01-16 DIAGNOSIS — M542 Cervicalgia: Secondary | ICD-10-CM | POA: Diagnosis present

## 2020-01-18 ENCOUNTER — Ambulatory Visit: Payer: Medicare Other | Attending: Internal Medicine

## 2020-01-18 DIAGNOSIS — Z23 Encounter for immunization: Secondary | ICD-10-CM

## 2020-01-18 NOTE — Progress Notes (Signed)
   Covid-19 Vaccination Clinic  Name:  Keith Gordon    MRN: [id number] DOB: [birth year]  01/18/2020  Mr. Keith Gordon was observed post Covid-19 immunization for 15 minutes without incident. He was provided with Vaccine Information Sheet and instruction to access the V-Safe system.   Mr. Keith Gordon was instructed to call 911 with any severe reactions post vaccine: Marland Kitchen Difficulty breathing  . Swelling of face and throat  . A fast heartbeat  . A bad rash all over body  . Dizziness and weakness   Immunizations Administered    Name Date Dose VIS Date Route   Pfizer COVID-19 Vaccine 01/18/2020 11:14 AM 0.3 mL 10/05/2019 Intramuscular   Manufacturer: [place]   Lot: [id number]   [place]: [id number]

## 2020-03-14 ENCOUNTER — Other Ambulatory Visit: Payer: Self-pay | Admitting: Internal Medicine

## 2020-03-14 ENCOUNTER — Other Ambulatory Visit: Payer: Self-pay

## 2020-03-14 ENCOUNTER — Ambulatory Visit
Admission: RE | Admit: 2020-03-14 | Discharge: 2020-03-14 | Disposition: A | Discharge status: Home / Self Care | Payer: Medicare Other | Source: Ambulatory Visit | Attending: Internal Medicine | Admitting: Internal Medicine

## 2020-03-14 DIAGNOSIS — I83892 Varicose veins of left lower extremities with other complications: Secondary | ICD-10-CM | POA: Diagnosis present

## 2020-04-30 ENCOUNTER — Ambulatory Visit: Payer: Medicare Other

## 2020-04-30 ENCOUNTER — Ambulatory Visit: Payer: Medicare Other | Admitting: Podiatry

## 2020-04-30 ENCOUNTER — Encounter: Payer: Self-pay | Admitting: Podiatry

## 2020-04-30 ENCOUNTER — Other Ambulatory Visit: Payer: Self-pay

## 2020-04-30 DIAGNOSIS — L600 Ingrowing nail: Secondary | ICD-10-CM

## 2020-04-30 DIAGNOSIS — M7752 Other enthesopathy of left foot: Secondary | ICD-10-CM

## 2020-04-30 MED ORDER — NEOMYCIN-POLYMYXIN-HC 1 % OT SOLN: OTIC | 1 refills | Status: DC

## 2020-04-30 NOTE — Progress Notes (Signed)
He presents today chief complaint of ingrown toenail fibular border of the third toe of the left foot.  Objective: Vital signs stable alert oriented x3.  Pulses are palpable.  Sharp in rate and MRs 1 fibular border third toe of the left foot sharply incurvated painful mildly erythematous no purulence no malodor.  Assessment: Ingrown toenail fibular border third digit left foot.  Plan: At this point after local anesthetic was administered, the matricectomy was performed to the lateral border third digit left foot.  Tolerated procedure well without complications was provided with both oral and written home-going instruction for the care and soaking of the toe.  He was provided with both oral and written wound instructions as well as a prescription for Cortisporin Otic to be applied twice daily after soaking.  I will follow-up with him in 2 weeks just to make sure he is doing well.

## 2020-04-30 NOTE — Patient Instructions (Signed)

## 2020-05-06 ENCOUNTER — Telehealth: Payer: Self-pay

## 2020-05-06 MED ORDER — DOXYCYCLINE HYCLATE 100 MG PO TABS: 100.0000 mg | ORAL_TABLET | Freq: Two times a day (BID) | ORAL | 0 refills | Status: DC

## 2020-05-06 NOTE — Telephone Encounter (Signed)
"  I'm calling you again today.  My toe is not getting any better.  It's red and swollen.  I'd appreciate a call back."

## 2020-05-06 NOTE — Telephone Encounter (Signed)
Patient called stated that his toenail that Dr. Milinda Pointer removed has become very red, swollen, painful to walk and has been bleeding for the past 2 days and he is concerned for infection.  He said he has been soaking in Epson salt and using antibiotic drops.  Per Dr. Posey Pronto, ok to send in Doxycycline 100mg  bid x 10 days.  I informed the patient of the antibiotic and instructed him to try soaking in Betadine and water, dry well and apply drops as directed.  He verbalized instructions and will call if s/s worsen.

## 2020-05-07 ENCOUNTER — Ambulatory Visit: Payer: Medicare Other | Admitting: Podiatry

## 2020-05-13 ENCOUNTER — Other Ambulatory Visit: Payer: Self-pay

## 2020-05-13 ENCOUNTER — Ambulatory Visit: Payer: Medicare Other | Admitting: Podiatry

## 2020-05-13 DIAGNOSIS — L600 Ingrowing nail: Secondary | ICD-10-CM | POA: Diagnosis not present

## 2020-05-13 DIAGNOSIS — L97521 Non-pressure chronic ulcer of other part of left foot limited to breakdown of skin: Secondary | ICD-10-CM | POA: Diagnosis not present

## 2020-05-14 ENCOUNTER — Encounter: Payer: Self-pay | Admitting: Podiatry

## 2020-05-14 NOTE — Progress Notes (Signed)
Subjective:  Patient ID: Keith Gordon, male    DOB: Mar 28, 1941,  MRN: [id number]  Chief Complaint  Patient presents with  . Nail Problem    pt is here for a possible ingrown toenail f/u of the left 3rd toe, pt states that the nail is feeling worse since the last time he was here, and is looking to have his feet looked at.    79 y.o. male presents with the above complaint.  Patient presents with a follow-up of left third digit ingrown status post ingrown removal by Dr. Milinda Pointer with chemical matricectomy now with concern for wound patient states that it feels worse than before the ingrown toenail was removed.  Patient states it hurts to put pressure on it.  He has been soaking it with Epson salt and has been taking antibiotics.  He just wants to get in and get it evaluated make sure it is not worsening or getting further infected.  He denies any other acute complaints.  He denies any nausea fever chills vomiting.   Review of Systems: Negative except as noted in the HPI. Denies N/V/F/Ch.  Past Medical History:  Diagnosis Date  . Arthritis   . Asthma    as a child  . Bleeding ulcer 1982  . 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 Dysrhythmia   . Family history of adverse reaction to anesthesia    son severe nausea and vomiting  . Pneumonia 1960's   history of  . PONV (postoperative nausea and vomiting)     Current Outpatient Medications:  .  doxycycline (VIBRA-TABS) 100 MG tablet, Take 1 tablet (100 mg total) by mouth 2 (two) times daily., Disp: 20 tablet, Rfl: 0 .  NEOMYCIN-POLYMYXIN-HYDROCORTISONE (CORTISPORIN) 1 % SOLN OTIC solution, Apply 1-2 drops to toe BID after soaking, Disp: 10 mL, Rfl: 1  Social History   Tobacco Use  Smoking Status Former Smoker  . Quit date: 02/07/1965  . Years since quitting: 55.3  Smokeless Tobacco Never Used    Allergies  Allergen Reactions  . Demerol [Meperidine] Nausea And Vomiting  . Dust Mite Extract Other (See Comments)  .  Levofloxacin Other (See Comments)    Spots on legs   . Sulfa Antibiotics Rash   Objective:  There were no vitals filed for this visit. There is no height or weight on file to calculate BMI. Constitutional Well developed. Well nourished.  Vascular Dorsalis pedis pulses palpable bilaterally. Posterior tibial pulses palpable bilaterally. Capillary refill normal to all digits.  No cyanosis or clubbing noted. Pedal hair growth normal.  Neurologic Normal speech. Oriented to person, place, and time. Epicritic sensation to light touch grossly present bilaterally.  Dermatologic  site of previous nail removed ingrown noted.  Mild erythema present this is likely due to chemical reaction as opposed to infectious related.  No concern for regrowing of the nail.  Mild macerated tissue noted as well.  Orthopedic: Normal joint ROM without pain or crepitus bilaterally. No visible deformities. No bony tenderness.   Radiographs: None Assessment:   1. Ingrown nail of third toe of left foot   2. Skin ulcer of third toe, left, limited to breakdown of skin [facility])    Plan:  Patient was evaluated and treated and all questions answered.  Left third digit superficial ulceration secondary to left ingrown nail procedure -I explained to the patient the etiology of ulceration and various treatment options were extensively discussed.  At this point I have asked the  patient that we will treat this like a wound and do local wound care until has healed. -The erythema that is present I am not sure if this is infectious related as opposed to a chemical reaction to phenol matricectomy.  To me it seems like this is likely chemical reaction as opposed to infectious related as there is no other signs of infection such as purulent drainage or abscess formation or anything else. -I have asked the patient do Betadine wet-to-dry dressing changes 3 times a week -He will complete his doxycycline course -He will be in a surgical  shoe to offload the third digit.   No follow-ups on file.

## 2020-05-19 ENCOUNTER — Ambulatory Visit: Payer: Medicare Other | Admitting: Podiatry

## 2020-05-21 ENCOUNTER — Ambulatory Visit: Payer: Medicare Other | Admitting: Podiatry

## 2020-05-29 ENCOUNTER — Ambulatory Visit: Payer: Medicare Other | Admitting: Podiatry

## 2020-05-29 ENCOUNTER — Encounter: Payer: Self-pay | Admitting: Podiatry

## 2020-05-29 ENCOUNTER — Other Ambulatory Visit: Payer: Self-pay

## 2020-05-29 DIAGNOSIS — L97521 Non-pressure chronic ulcer of other part of left foot limited to breakdown of skin: Secondary | ICD-10-CM

## 2020-05-30 ENCOUNTER — Encounter: Payer: Self-pay | Admitting: Podiatry

## 2020-05-30 NOTE — Progress Notes (Signed)
  Subjective:  Patient ID: Keith Gordon, male    DOB: 1941-04-11,  MRN: [id number]  Chief Complaint  Patient presents with  . Ingrown Toenail    "its still sore but a little better"    79 y.o. male presents with the above complaint.  Patient presents with a follow-up of left third digit ingrown status post ingrown removal by Dr. Milinda Pointer.  Patient states he is doing little bit better his pain is well controlled.  Has been doing Betadine wet-to-dry dressing change he however has been putting hydrogen peroxide on it as well.  He denies any other acute complaints.  Review of Systems: Negative except as noted in the HPI. Denies N/V/F/Ch.  Past Medical History:  Diagnosis Date  . Arthritis   . Asthma    as a child  . Bleeding ulcer 1982  . 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 Dysrhythmia   . Family history of adverse reaction to anesthesia    son severe nausea and vomiting  . Pneumonia 1960's   history of  . PONV (postoperative nausea and vomiting)     Current Outpatient Medications:  .  doxycycline (VIBRA-TABS) 100 MG tablet, Take 1 tablet (100 mg total) by mouth 2 (two) times daily., Disp: 20 tablet, Rfl: 0 .  NEOMYCIN-POLYMYXIN-HYDROCORTISONE (CORTISPORIN) 1 % SOLN OTIC solution, Apply 1-2 drops to toe BID after soaking, Disp: 10 mL, Rfl: 1  Social History   Tobacco Use  Smoking Status Former Smoker  . Quit date: 02/07/1965  . Years since quitting: 55.3  Smokeless Tobacco Never Used    Allergies  Allergen Reactions  . Demerol [Meperidine] Nausea And Vomiting  . Dust Mite Extract Other (See Comments)  . Levofloxacin Other (See Comments)    Spots on legs   . Sulfa Antibiotics Rash   Objective:  There were no vitals filed for this visit. There is no height or weight on file to calculate BMI. Constitutional Well developed. Well nourished.  Vascular Dorsalis pedis pulses palpable bilaterally. Posterior tibial pulses palpable bilaterally. Capillary refill  normal to all digits.  No cyanosis or clubbing noted. Pedal hair growth normal.  Neurologic Normal speech. Oriented to person, place, and time. Epicritic sensation to light touch grossly present bilaterally.  Dermatologic  site of previous nail removed ingrown noted.  Erythema/chemical reaction is completely resolved now.  No concern for regrowing of the nail.  Mild macerated tissue noted as well.  Orthopedic: Normal joint ROM without pain or crepitus bilaterally. No visible deformities. No bony tenderness.   Radiographs: None Assessment:   1. Skin ulcer of third toe, left, limited to breakdown of skin [facility])    Plan:  Patient was evaluated and treated and all questions answered.  Left third digit superficial ulceration secondary to left ingrown nail procedure~ improving -Clinically it is improving.  Patient's pain has gone down as well.  There is still an ulceration present for which patient will continue to do Betadine wet-to-dry.  I have asked him to stop doing hydrogen peroxide to the area.  Patient understands. -Continue Betadine wet-to-dry dressing changes 3 times a week -I will hold off on any further doxycycline course as there is no clinical signs of infection noted.  The redness has resolved -Continue surgical shoe   No follow-ups on file.

## 2020-06-19 ENCOUNTER — Ambulatory Visit: Payer: Medicare Other | Admitting: Podiatry

## 2020-07-02 ENCOUNTER — Other Ambulatory Visit: Payer: Self-pay

## 2020-07-02 ENCOUNTER — Ambulatory Visit: Payer: Medicare Other | Admitting: Urology

## 2020-07-02 ENCOUNTER — Encounter: Payer: Self-pay | Admitting: Urology

## 2020-07-02 VITALS — BP 124/77 | HR 66 | Ht 75.0 in | Wt 192.0 lb

## 2020-07-02 DIAGNOSIS — N138 Other obstructive and reflux uropathy: Secondary | ICD-10-CM | POA: Diagnosis not present

## 2020-07-02 DIAGNOSIS — N401 Enlarged prostate with lower urinary tract symptoms: Secondary | ICD-10-CM

## 2020-07-02 DIAGNOSIS — Z125 Encounter for screening for malignant neoplasm of prostate: Secondary | ICD-10-CM | POA: Diagnosis not present

## 2020-07-02 DIAGNOSIS — R350 Frequency of micturition: Secondary | ICD-10-CM

## 2020-07-02 LAB — BLADDER SCAN AMB NON-IMAGING: Scan Result: 293

## 2020-07-02 MED ORDER — TAMSULOSIN HCL 0.4 MG PO CAPS: 0.4000 mg | ORAL_CAPSULE | Freq: Every day | ORAL | 11 refills | Status: DC

## 2020-07-02 NOTE — Patient Instructions (Addendum)
Benign Prostatic Hyperplasia  Benign prostatic hyperplasia (BPH) is an enlarged prostate gland that is caused by the normal aging process and not by cancer. The prostate is a walnut-sized gland that is involved in the production of semen. It is located in front of the rectum and below the bladder. The bladder stores urine and the urethra is the tube that carries the urine out of the body. The prostate may get bigger as a man gets older. An enlarged prostate can press on the urethra. This can make it harder to pass urine. The build-up of urine in the bladder can cause infection. Back pressure and infection may progress to bladder damage and kidney (renal) failure. What are the causes? This condition is part of a normal aging process. However, not all men develop problems from this condition. If the prostate enlarges away from the urethra, urine flow will not be blocked. If it enlarges toward the urethra and compresses it, there will be problems passing urine. What increases the risk? This condition is more likely to develop in men over the age of 50 years. What are the signs or symptoms? Symptoms of this condition include:  Getting up often during the night to urinate.  Needing to urinate frequently during the day.  Difficulty starting urine flow.  Decrease in size and strength of your urine stream.  Leaking (dribbling) after urinating.  Inability to pass urine. This needs immediate treatment.  Inability to completely empty your bladder.  Pain when you pass urine. This is more common if there is also an infection.  Urinary tract infection (UTI). How is this diagnosed? This condition is diagnosed based on your medical history, a physical exam, and your symptoms. Tests will also be done, such as:  A post-void bladder scan. This measures any amount of urine that may remain in your bladder after you finish urinating.  A digital rectal exam. In a rectal exam, your health care provider  checks your prostate by putting a lubricated, gloved finger into your rectum to feel the back of your prostate gland. This exam detects the size of your gland and any abnormal lumps or growths.  An exam of your urine (urinalysis).  A prostate specific antigen (PSA) screening. This is a blood test used to screen for prostate cancer.  An ultrasound. This test uses sound waves to electronically produce a picture of your prostate gland. Your health care provider may refer you to a specialist in kidney and prostate diseases (urologist). How is this treated? Once symptoms begin, your health care provider will monitor your condition (active surveillance or watchful waiting). Treatment for this condition will depend on the severity of your condition. Treatment may include:  Observation and yearly exams. This may be the only treatment needed if your condition and symptoms are mild.  Medicines to relieve your symptoms, including: ? Medicines to shrink the prostate. ? Medicines to relax the muscle of the prostate.  Surgery in severe cases. Surgery may include: ? Prostatectomy. In this procedure, the prostate tissue is removed completely through an open incision or with a laparoscope or robotics. ? Transurethral resection of the prostate (TURP). In this procedure, a tool is inserted through the opening at the tip of the penis (urethra). It is used to cut away tissue of the inner core of the prostate. The pieces are removed through the same opening of the penis. This removes the blockage. ? Transurethral incision (TUIP). In this procedure, small cuts are made in the prostate. This lessens   the prostate's pressure on the urethra. ? Transurethral microwave thermotherapy (TUMT). This procedure uses microwaves to create heat. The heat destroys and removes a small amount of prostate tissue. ? Transurethral needle ablation (TUNA). This procedure uses radio frequencies to destroy and remove a small amount of  prostate tissue. ? Interstitial laser coagulation (ILC). This procedure uses a laser to destroy and remove a small amount of prostate tissue. ? Transurethral electrovaporization (TUVP). This procedure uses electrodes to destroy and remove a small amount of prostate tissue. ? Prostatic urethral lift. This procedure inserts an implant to push the lobes of the prostate away from the urethra. Follow these instructions at home:  Take over-the-counter and prescription medicines only as told by your health care provider.  Monitor your symptoms for any changes. Contact your health care provider with any changes.  Avoid drinking large amounts of liquid before going to bed or out in public.  Avoid or reduce how much caffeine or alcohol you drink.  Give yourself time when you urinate.  Keep all follow-up visits as told by your health care provider. This is important. Contact a health care provider if:  You have unexplained back pain.  Your symptoms do not get better with treatment.  You develop side effects from the medicine you are taking.  Your urine becomes very dark or has a bad smell.  Your lower abdomen becomes distended and you have trouble passing your urine. Get help right away if:  You have a fever or chills.  You suddenly cannot urinate.  You feel lightheaded, or very dizzy, or you faint.  There are large amounts of blood or clots in the urine.  Your urinary problems become hard to manage.  You develop moderate to severe low back or flank pain. The flank is the side of your body between the ribs and the hip. These symptoms may represent a serious problem that is an emergency. Do not wait to see if the symptoms will go away. Get medical help right away. Call your local emergency services (911 in the [place] Do not drive yourself to the hospital. Summary  Benign prostatic hyperplasia (BPH) is an enlarged prostate that is caused by the normal aging process and not by  cancer.  An enlarged prostate can press on the urethra. This can make it hard to pass urine.  This condition is part of a normal aging process and is more likely to develop in men over the age of 50 years.  Get help right away if you suddenly cannot urinate. This information is not intended to replace advice given to you by your health care provider. Make sure you discuss any questions you have with your health care provider. Document Revised: 09/05/2018 Document Reviewed: 11/15/2016 Elsevier Patient Education  2020 Elsevier Inc. Tamsulosin capsules What is this medicine? TAMSULOSIN (tam SOO loe sin) is an alpha blocker. It is used to treat the signs and symptoms of an enlarged prostate in men. This condition is also called benign prostatic hyperplasia (BPH). This medicine may be used for other purposes; ask your health care provider or pharmacist if you have questions. COMMON BRAND NAME(S): Flomax What should I tell my health care provider before I take this medicine? They need to know if you have any of the following conditions:  advanced kidney disease  advanced liver disease  low blood pressure  prostate cancer  an unusual or allergic reaction to tamsulosin, sulfa drugs, other medicines, foods, dyes, or preservatives  pregnant or trying to   get pregnant  breast-feeding How should I use this medicine? Take this medicine by mouth about 30 minutes after the same meal every day. Follow the directions on the prescription label. Swallow the capsules whole with a glass of water. Do not crush, chew, or open capsules. Do not take your medicine more often than directed. Do not stop taking your medicine unless your doctor tells you to. Talk to your pediatrician regarding the use of this medicine in children. Special care may be needed. Overdosage: If you think you have taken too much of this medicine contact a poison control center or emergency room at once. NOTE: This medicine is only  for you. Do not share this medicine with others. What if I miss a dose? If you miss a dose, take it as soon as you can. If it is almost time for your next dose, take only that dose. Do not take double or extra doses. If you stop taking your medicine for several days or more, ask your doctor or health care professional what dose you should start back on. What may interact with this medicine?  cimetidine  fluoxetine  ketoconazole  medicines for erectile disfunction like sildenafil, tadalafil, vardenafil  medicines for high blood pressure  other alpha-blockers like alfuzosin, doxazosin, phentolamine, phenoxybenzamine, prazosin, terazosin  warfarin This list may not describe all possible interactions. Give your health care provider a list of all the medicines, herbs, non-prescription drugs, or dietary supplements you use. Also tell them if you smoke, drink alcohol, or use illegal drugs. Some items may interact with your medicine. What should I watch for while using this medicine? Visit your doctor or health care professional for regular check ups. You will need lab work done before you start this medicine and regularly while you are taking it. Check your blood pressure as directed. Ask your health care professional what your blood pressure should be, and when you should contact him or her. This medicine may make you feel dizzy or lightheaded. This is more likely to happen after the first dose, after an increase in dose, or during hot weather or exercise. Drinking alcohol and taking some medicines can make this worse. Do not drive, use machinery, or do anything that needs mental alertness until you know how this medicine affects you. Do not sit or stand up quickly. If you begin to feel dizzy, sit down until you feel better. These effects can decrease once your body adjusts to the medicine. Contact your doctor or health care professional right away if you have an erection that lasts longer than 4  hours or if it becomes painful. This may be a sign of a serious problem and must be treated right away to prevent permanent damage. If you are thinking of having cataract surgery, tell your eye surgeon that you have taken this medicine. What side effects may I notice from receiving this medicine? Side effects that you should report to your doctor or health care professional as soon as possible:  allergic reactions like skin rash or itching, hives, swelling of the lips, mouth, tongue, or throat  breathing problems  change in vision  feeling faint or lightheaded  irregular heartbeat  prolonged or painful erection  weakness Side effects that usually do not require medical attention (report to your doctor or health care professional if they continue or are bothersome):  back pain  change in sex drive or performance  constipation, nausea or vomiting  cough  drowsy  runny or stuffy nose    trouble sleeping This list may not describe all possible side effects. Call your doctor for medical advice about side effects. You may report side effects to FDA at 1-800-FDA-1088. Where should I keep my medicine? Keep out of the reach of children. Store at room temperature between 15 and 30 degrees C (59 and 86 degrees F). Throw away any unused medicine after the expiration date. NOTE: This sheet is a summary. It may not cover all possible information. If you have questions about this medicine, talk to your doctor, pharmacist, or health care provider.  2020 Elsevier/Gold Standard (2018-03-16 12:54:06)  

## 2020-07-02 NOTE — Progress Notes (Signed)
   07/02/2020 12:11 PM   Fabrizio Filip Mill [birth year] [id number]  Reason for visit: New urinary symptoms  HPI: I saw Mr. Ridgley in urology clinic for 1 to 2 months of new urinary symptoms including slow stream in the mornings, urgency, weak stream, and urinary frequency.  I originally saw him in June 2020 for a prostate nodule.  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  After a extensive conversation about the options, he elected to defer prostate biopsy which is very reasonable.  PSA was checked again by PCP in February 2021 and was stable again at 2.51.  He denies any new diet changes, though has had increased stress and anxiety with his wife going to hospice.  Urinalysis today is completely benign with 0-5 WBCs, 0 RBCs, no bacteria, nitrite negative.  No recent cross-sectional imaging to review.  He has never tried any medications for urinary symptoms previously.  I recommended a trial of Flomax for his urinary symptoms.  Risks and benefits were discussed at length.  We discussed close follow-up in 3 to 4 weeks for symptom check, and if no improvement consider cystoscopy at that time for further evaluation.  Billey Co, [facility] [address], [place], [place] [address] [phone]

## 2020-07-03 LAB — URINALYSIS, COMPLETE
Bilirubin, UA: NEGATIVE
Glucose, UA: NEGATIVE
Ketones, UA: NEGATIVE
Leukocytes,UA: NEGATIVE
Nitrite, UA: NEGATIVE
Protein,UA: NEGATIVE
RBC, UA: NEGATIVE
Specific Gravity, UA: >1.03 — ABNORMAL HIGH (ref 1.005–1.030)
Urobilinogen, Ur: 0.2 mg/dL (ref 0.2–1.0)
pH, UA: 6 (ref 5.0–7.5)

## 2020-07-03 LAB — MICROSCOPIC EXAMINATION
Bacteria, UA: NONE SEEN
Epithelial Cells (non renal): NONE SEEN /hpf (ref 0–10)
RBC, Urine: NONE SEEN /hpf (ref 0–2)

## 2020-07-31 ENCOUNTER — Encounter: Payer: Self-pay | Admitting: Urology

## 2020-07-31 ENCOUNTER — Other Ambulatory Visit: Payer: Self-pay

## 2020-07-31 ENCOUNTER — Ambulatory Visit: Payer: Medicare Other | Admitting: Urology

## 2020-07-31 VITALS — BP 135/68 | HR 53 | Ht 75.0 in | Wt 191.0 lb

## 2020-07-31 DIAGNOSIS — N401 Enlarged prostate with lower urinary tract symptoms: Secondary | ICD-10-CM | POA: Diagnosis not present

## 2020-07-31 DIAGNOSIS — N138 Other obstructive and reflux uropathy: Secondary | ICD-10-CM

## 2020-07-31 LAB — BLADDER SCAN AMB NON-IMAGING

## 2020-07-31 MED ORDER — ALFUZOSIN HCL ER 10 MG PO TB24: 10.0000 mg | ORAL_TABLET | Freq: Every day | ORAL | 3 refills | Status: DC

## 2020-07-31 NOTE — Patient Instructions (Signed)
Cystoscopy Cystoscopy is a procedure that is used to help diagnose and sometimes treat conditions that affect the lower urinary tract. The lower urinary tract includes the bladder and the urethra. The urethra is the tube that drains urine from the bladder. Cystoscopy is done using a thin, tube-shaped instrument with a light and camera at the end (cystoscope). The cystoscope may be hard or flexible, depending on the goal of the procedure. The cystoscope is inserted through the urethra, into the bladder. Cystoscopy may be recommended if you have:  Urinary tract infections that keep coming back.  Blood in the urine (hematuria).  An inability to control when you urinate (urinary incontinence) or an overactive bladder.  Unusual cells found in a urine sample.  A blockage in the urethra, such as a urinary stone.  Painful urination.  An abnormality in the bladder found during an intravenous pyelogram (IVP) or CT scan. Cystoscopy may also be done to remove a sample of tissue to be examined under a microscope (biopsy). What are the risks? Generally, this is a safe procedure. However, problems may occur, including:  Infection.  Bleeding.  What happens during the procedure?  1. You will be given one or more of the following: ? A medicine to numb the area (local anesthetic). 2. The area around the opening of your urethra will be cleaned. 3. The cystoscope will be passed through your urethra into your bladder. 4. Germ-free (sterile) fluid will flow through the cystoscope to fill your bladder. The fluid will stretch your bladder so that your health care provider can clearly examine your bladder walls. 5. Your doctor will look at the urethra and bladder. 6. The cystoscope will be removed The procedure may vary among health care providers  What can I expect after the procedure? After the procedure, it is common to have: 1. Some soreness or pain in your abdomen and urethra. 2. Urinary symptoms.  These include: ? Mild pain or burning when you urinate. Pain should stop within a few minutes after you urinate. This may last for up to 1 week. ? A small amount of blood in your urine for several days. ? Feeling like you need to urinate but producing only a small amount of urine. Follow these instructions at home: General instructions  Return to your normal activities as told by your health care provider.   Do not drive for 24 hours if you were given a sedative during your procedure.  Watch for any blood in your urine. If the amount of blood in your urine increases, call your health care provider.  If a tissue sample was removed for testing (biopsy) during your procedure, it is up to you to get your test results. Ask your health care provider, or the department that is doing the test, when your results will be ready.  Drink enough fluid to keep your urine pale yellow.  Keep all follow-up visits as told by your health care provider. This is important. Contact a health care provider if you:  Have pain that gets worse or does not get better with medicine, especially pain when you urinate.  Have trouble urinating.  Have more blood in your urine. Get help right away if you:  Have blood clots in your urine.  Have abdominal pain.  Have a fever or chills.  Are unable to urinate. Summary  Cystoscopy is a procedure that is used to help diagnose and sometimes treat conditions that affect the lower urinary tract.  Cystoscopy is done using   a thin, tube-shaped instrument with a light and camera at the end.  After the procedure, it is common to have some soreness or pain in your abdomen and urethra.  Watch for any blood in your urine. If the amount of blood in your urine increases, call your health care provider.  If you were prescribed an antibiotic medicine, take it as told by your health care provider. Do not stop taking the antibiotic even if you start to feel better. This  information is not intended to replace advice given to you by your health care provider. Make sure you discuss any questions you have with your health care provider. Document Revised: 10/03/2018 Document Reviewed: 10/03/2018 Elsevier Patient Education  2020 [employer]   Benign Prostatic Hyperplasia  Benign prostatic hyperplasia (BPH) is an enlarged prostate gland that is caused by the normal aging process and not by cancer. The prostate is a walnut-sized gland that is involved in the production of semen. It is located in front of the rectum and below the bladder. The bladder stores urine and the urethra is the tube that carries the urine out of the body. The prostate may get bigger as a man gets older. An enlarged prostate can press on the urethra. This can make it harder to pass urine. The build-up of urine in the bladder can cause infection. Back pressure and infection may progress to bladder damage and kidney (renal) failure. What are the causes? This condition is part of a normal aging process. However, not all men develop problems from this condition. If the prostate enlarges away from the urethra, urine flow will not be blocked. If it enlarges toward the urethra and compresses it, there will be problems passing urine. What increases the risk? This condition is more likely to develop in men over the age of 50 years. What are the signs or symptoms? Symptoms of this condition include:  Getting up often during the night to urinate.  Needing to urinate frequently during the day.  Difficulty starting urine flow.  Decrease in size and strength of your urine stream.  Leaking (dribbling) after urinating.  Inability to pass urine. This needs immediate treatment.  Inability to completely empty your bladder.  Pain when you pass urine. This is more common if there is also an infection.  Urinary tract infection (UTI). How is this diagnosed? This condition is diagnosed based on your  medical history, a physical exam, and your symptoms. Tests will also be done, such as:  A post-void bladder scan. This measures any amount of urine that may remain in your bladder after you finish urinating.  A digital rectal exam. In a rectal exam, your health care provider checks your prostate by putting a lubricated, gloved finger into your rectum to feel the back of your prostate gland. This exam detects the size of your gland and any abnormal lumps or growths.  An exam of your urine (urinalysis).  A prostate specific antigen (PSA) screening. This is a blood test used to screen for prostate cancer.  An ultrasound. This test uses sound waves to electronically produce a picture of your prostate gland. Your health care provider may refer you to a specialist in kidney and prostate diseases (urologist). How is this treated? Once symptoms begin, your health care provider will monitor your condition (active surveillance or watchful waiting). Treatment for this condition will depend on the severity of your condition. Treatment may include:  Observation and yearly exams. This may be the only treatment needed   if your condition and symptoms are mild.  Medicines to relieve your symptoms, including: ? Medicines to shrink the prostate. ? Medicines to relax the muscle of the prostate.  Surgery in severe cases. Surgery may include: ? Prostatectomy. In this procedure, the prostate tissue is removed completely through an open incision or with a laparoscope or robotics. ? Transurethral resection of the prostate (TURP). In this procedure, a tool is inserted through the opening at the tip of the penis (urethra). It is used to cut away tissue of the inner core of the prostate. The pieces are removed through the same opening of the penis. This removes the blockage. ? Transurethral incision (TUIP). In this procedure, small cuts are made in the prostate. This lessens the prostate's pressure on the  urethra. ? Transurethral microwave thermotherapy (TUMT). This procedure uses microwaves to create heat. The heat destroys and removes a small amount of prostate tissue. ? Transurethral needle ablation (TUNA). This procedure uses radio frequencies to destroy and remove a small amount of prostate tissue. ? Interstitial laser coagulation (ILC). This procedure uses a laser to destroy and remove a small amount of prostate tissue. ? Transurethral electrovaporization (TUVP). This procedure uses electrodes to destroy and remove a small amount of prostate tissue. ? Prostatic urethral lift. This procedure inserts an implant to push the lobes of the prostate away from the urethra. Follow these instructions at home:  Take over-the-counter and prescription medicines only as told by your health care provider.  Monitor your symptoms for any changes. Contact your health care provider with any changes.  Avoid drinking large amounts of liquid before going to bed or out in public.  Avoid or reduce how much caffeine or alcohol you drink.  Give yourself time when you urinate.  Keep all follow-up visits as told by your health care provider. This is important. Contact a health care provider if:  You have unexplained back pain.  Your symptoms do not get better with treatment.  You develop side effects from the medicine you are taking.  Your urine becomes very dark or has a bad smell.  Your lower abdomen becomes distended and you have trouble passing your urine. Get help right away if:  You have a fever or chills.  You suddenly cannot urinate.  You feel lightheaded, or very dizzy, or you faint.  There are large amounts of blood or clots in the urine.  Your urinary problems become hard to manage.  You develop moderate to severe low back or flank pain. The flank is the side of your body between the ribs and the hip. These symptoms may represent a serious problem that is an emergency. Do not wait to  see if the symptoms will go away. Get medical help right away. Call your local emergency services (911 in the [place] Do not drive yourself to the hospital. Summary  Benign prostatic hyperplasia (BPH) is an enlarged prostate that is caused by the normal aging process and not by cancer.  An enlarged prostate can press on the urethra. This can make it hard to pass urine.  This condition is part of a normal aging process and is more likely to develop in men over the age of 50 years.  Get help right away if you suddenly cannot urinate. This information is not intended to replace advice given to you by your health care provider. Make sure you discuss any questions you have with your health care provider. Document Revised: 09/05/2018 Document Reviewed: 11/15/2016 Elsevier Patient Education    2020 Elsevier Inc.  

## 2020-07-31 NOTE — Progress Notes (Signed)
   07/31/2020 10:53 AM   Sanda Klein 1940/12/06 [id number]  Reason for visit: Follow up nodular prostate, BPH and urinary symptoms  HPI: I saw Mr. Jolley back in urology clinic for follow-up of urinary symptoms.  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  We previously discussed the risks and benefits of prostate biopsy, and he deferred further evaluation with biopsy.  I saw him again in September 2021 with worsening urinary symptoms of weak stream, urgency, and urinary frequency.  Urinalysis was benign, and he opted for a trial of Flomax.  He noted improvement in his urinary stream on the Flomax, but had bothersome side effects of headache and discontinued this medication.  He continues to have bothersome urinary symptoms, and IPSS score today is 16, with quality of life mostly satisfied.  PVR significantly elevated at 330 mL.  I recommended cystoscopy and TRUS today for evaluation of an outlet procedure with his persistently bothersome urinary symptoms, as well as elevated PVR, and my concern that he could develop urinary retention.  His wife is on hospice at home, and he needs to get back to her today so we will reschedule for those procedures.  I recommended a trial of alfuzosin in the meantime to see if this will improve his urinary symptoms without the bothersome side effects he had from Flomax.  We discussed return precautions at length including gross hematuria, inability to urinate with lower abdominal pain, and UTI.  We discussed the importance of cystoscopy/TRUS for evaluation of the prostate size and anatomy, evaluation of urethral stricture, and further information to help establish the most beneficial outlet procedure in the future.  RTC for cystoscopy and TRUS Trial of alfuzosin  Billey Co, MD  [facility] [address] [place], [place]  [address] [phone]

## 2020-09-12 ENCOUNTER — Other Ambulatory Visit: Payer: Self-pay | Admitting: Internal Medicine

## 2020-09-12 DIAGNOSIS — R634 Abnormal weight loss: Secondary | ICD-10-CM

## 2020-09-30 ENCOUNTER — Ambulatory Visit
Admission: RE | Admit: 2020-09-30 | Discharge: 2020-09-30 | Disposition: A | Discharge status: Home / Self Care | Payer: Medicare Other | Source: Ambulatory Visit | Attending: Internal Medicine | Admitting: Internal Medicine

## 2020-09-30 ENCOUNTER — Other Ambulatory Visit: Payer: Self-pay

## 2020-09-30 DIAGNOSIS — R634 Abnormal weight loss: Secondary | ICD-10-CM | POA: Insufficient documentation

## 2020-09-30 MED ORDER — IOHEXOL 300 MG/ML  SOLN
100.0000 mL | Freq: Once | INTRAMUSCULAR | Status: AC | PRN
  Administered 2020-09-30: 100 mL via INTRAVENOUS

## 2020-10-10 ENCOUNTER — Other Ambulatory Visit: Payer: Self-pay

## 2020-10-10 ENCOUNTER — Other Ambulatory Visit: Payer: Self-pay | Admitting: Physician Assistant

## 2020-10-10 ENCOUNTER — Ambulatory Visit
Admission: RE | Admit: 2020-10-10 | Discharge: 2020-10-10 | Disposition: A | Discharge status: Home / Self Care | Payer: Medicare Other | Source: Ambulatory Visit | Attending: Physician Assistant | Admitting: Physician Assistant

## 2020-10-10 DIAGNOSIS — M7989 Other specified soft tissue disorders: Secondary | ICD-10-CM | POA: Diagnosis present

## 2021-04-07 ENCOUNTER — Other Ambulatory Visit: Payer: Self-pay

## 2021-04-07 ENCOUNTER — Ambulatory Visit: Payer: Medicare Other | Admitting: Dermatology

## 2021-04-07 DIAGNOSIS — D229 Melanocytic nevi, unspecified: Secondary | ICD-10-CM

## 2021-04-07 DIAGNOSIS — D3617 Benign neoplasm of peripheral nerves and autonomic nervous system of trunk, unspecified: Secondary | ICD-10-CM | POA: Diagnosis not present

## 2021-04-07 DIAGNOSIS — L821 Other seborrheic keratosis: Secondary | ICD-10-CM

## 2021-04-07 DIAGNOSIS — D225 Melanocytic nevi of trunk: Secondary | ICD-10-CM

## 2021-04-07 DIAGNOSIS — D485 Neoplasm of uncertain behavior of skin: Secondary | ICD-10-CM

## 2021-04-07 DIAGNOSIS — D3610 Benign neoplasm of peripheral nerves and autonomic nervous system, unspecified: Secondary | ICD-10-CM

## 2021-04-07 DIAGNOSIS — L578 Other skin changes due to chronic exposure to nonionizing radiation: Secondary | ICD-10-CM

## 2021-04-07 DIAGNOSIS — Z85828 Personal history of other malignant neoplasm of skin: Secondary | ICD-10-CM | POA: Diagnosis not present

## 2021-04-07 DIAGNOSIS — Z1283 Encounter for screening for malignant neoplasm of skin: Secondary | ICD-10-CM | POA: Diagnosis not present

## 2021-04-07 DIAGNOSIS — D489 Neoplasm of uncertain behavior, unspecified: Secondary | ICD-10-CM

## 2021-04-07 DIAGNOSIS — D1800 Hemangioma unspecified site: Secondary | ICD-10-CM

## 2021-04-07 DIAGNOSIS — L739 Follicular disorder, unspecified: Secondary | ICD-10-CM

## 2021-04-07 DIAGNOSIS — L814 Other melanin hyperpigmentation: Secondary | ICD-10-CM

## 2021-04-07 DIAGNOSIS — L738 Other specified follicular disorders: Secondary | ICD-10-CM

## 2021-04-07 NOTE — Progress Notes (Signed)
Follow-Up Visit   Subjective  Keith Gordon is a 80 y.o. male who presents for the following: Follow-up (Patient has a place on back, back of left leg, left thigh have been there for a while and keep coming back. Patient has a knot at left shoulder that he said has been there since he had shoulder surgery he would like checked. 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 ).  Spot on shoulder gets irritated, catches on clothing.  Patient here for full body skin exam and skin cancer screening.   The following portions of the chart were reviewed this encounter and updated as appropriate:        Objective  Well appearing patient in no apparent distress; mood and affect are within normal limits.  A full examination was performed including scalp, head, eyes, ears, nose, lips, neck, chest, axillae, abdomen, back, buttocks, bilateral upper extremities, bilateral lower extremities, hands, feet, fingers, toes, fingernails, and toenails. All findings within normal limits unless otherwise noted below.  Left Lower Back  pink flesh colored soft papule   left top of shoulder 1 cm fleshy papule      Left Lower Back and left posterior thigh Perifollicular erythematous papules   left abdomen 4 mm brown papule with lighter center   Assessment & Plan  Neurofibroma Left Lower Back  Benign, observe.   Discussed can be removed if bothersome but will leave scar and may recur.    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 Anesthesia: the lesion was anesthetized in a standard fashion   Anesthetic:  0.5% bupivicaine w/ epinephrine 1-100,000 local infiltration Instrument used: flexible razor blade   Hemostasis achieved with: pressure, aluminum chloride and electrodesiccation    Outcome: patient tolerated procedure well   Post-procedure details: wound care instructions given   Post-procedure details comment:  Ointment and small bandage applied.   Specimen 1 - Surgical pathology Differential Diagnosis: R/o irritated neurofibroma vs irritated nevus  Check Margins: No  1 cm fleshy papule   R/o irritated neurofibroma vs irritated nevus    Folliculitis Left Lower Back and left posterior thigh  Benign, observe.    Pt will call if bumps don't resolve  Nevus left abdomen  Benign-appearing.  Observation.  Call clinic for new or changing lesions.  Recommend daily use of broad spectrum spf 30+ sunscreen to sun-exposed areas.   Lentigines - Scattered tan macules - Due to sun exposure - Benign-appering, observe - Recommend daily broad spectrum sunscreen SPF 30+ to sun-exposed areas, reapply every 2 hours as needed. - Call for any changes  Sebaceous Hyperplasia - Small yellow papules with a central dell on face - Benign - Observe  Seborrheic Keratoses - Stuck-on, waxy, tan-brown papules and/or plaques left forearm, left lower leg  - Benign-appearing - Discussed benign etiology and prognosis. - Observe - Call for any changes  Melanocytic Nevi - Tan-brown and/or pink-flesh-colored symmetric macules and papules - Benign appearing on exam today - Observation - Call clinic for new or changing moles - Recommend daily use of broad spectrum spf 30+ sunscreen to sun-exposed areas.   Hemangiomas - Red papules - Discussed benign nature - Observe - Call for any changes  Actinic Damage - Chronic condition, secondary to cumulative UV/sun exposure - diffuse scaly erythematous macules with  underlying dyspigmentation - Recommend daily broad spectrum sunscreen SPF 30+ to sun-exposed areas, reapply every 2 hours as needed.  - Staying in the shade or wearing long sleeves, sun glasses (UVA+UVB protection) and wide brim hats (4-inch brim around the entire  circumference of the hat) are also recommended for sun protection.  - Call for new or changing lesions.  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  - Call if any new or changing lesions are noted between office visits  Skin cancer screening performed today.  Return in about 1 year (around 04/07/2022) for tbse. I, Ruthell Rummage, CMA, am acting as scribe for Brendolyn Patty, MD.  Documentation: I have reviewed the above documentation for accuracy and completeness, and I agree with the above.  Brendolyn Patty MD

## 2021-04-07 NOTE — Patient Instructions (Addendum)
Seborrheic Keratosis  What causes seborrheic keratoses? Seborrheic keratoses are harmless, common skin growths that first appear during adult life.  As time goes by, more growths appear.  Some people may develop a large number of them.  Seborrheic keratoses appear on both covered and uncovered body parts.  They are not caused by sunlight.  The tendency to develop seborrheic keratoses can be inherited.  They vary in color from skin-colored to gray, brown, or even black.  They can be either smooth or have a rough, warty surface.   Seborrheic keratoses are superficial and look as if they were stuck on the skin.  Under the microscope this type of keratosis looks like layers upon layers of skin.  That is why at times the top layer may seem to fall off, but the rest of the growth remains and re-grows.    Treatment Seborrheic keratoses do not need to be treated, but can easily be removed in the office.  Seborrheic keratoses often cause symptoms when they rub on clothing or jewelry.  Lesions can be in the way of shaving.  If they become inflamed, they can cause itching, soreness, or burning.  Removal of a seborrheic keratosis can be accomplished by freezing, burning, or surgery. If any spot bleeds, scabs, or grows rapidly, please return to have it checked, as these can be an indication of a skin cancer.  Biopsy Wound Care Instructions  Leave the original bandage on for 24 hours if possible.  If the bandage becomes soaked or soiled before that time, it is OK to remove it and examine the wound.  A small amount of post-operative bleeding is normal.  If excessive bleeding occurs, remove the bandage, place gauze over the site and apply continuous pressure (no peeking) over the area for 30 minutes. If this does not work, please call our clinic as soon as possible or page your doctor if it is after hours.   Once a day, cleanse the wound with soap and water. It is fine to shower. If a thick crust develops you may use  a Q-tip dipped into dilute hydrogen peroxide (mix 1:1 with water) to dissolve it.  Hydrogen peroxide can slow the healing process, so use it only as needed.    After washing, apply petroleum jelly (Vaseline) or an antibiotic ointment if your doctor prescribed one for you, followed by a bandage.    For best healing, the wound should be covered with a layer of ointment at all times. If you are not able to keep the area covered with a bandage to hold the ointment in place, this may mean re-applying the ointment several times a day.  Continue this wound care until the wound has healed and is no longer open.   Itching and mild discomfort is normal during the healing process. However, if you develop pain or severe itching, please call our office.   If you have any discomfort, you can take Tylenol (acetaminophen) or ibuprofen as directed on the bottle. (Please do not take these if you have an allergy to them or cannot take them for another reason).  Some redness, tenderness and white or yellow material in the wound is normal healing.  If the area becomes very sore and red, or develops a thick yellow-green material (pus), it may be infected; please notify us.    If you have stitches, return to clinic as directed to have the stitches removed. You will continue wound care for 2-3 days after the stitches are  removed.   Wound healing continues for up to one year following surgery. It is not unusual to experience pain in the scar from time to time during the interval.  If the pain becomes severe or the scar thickens, you should notify the office.    A slight amount of redness in a scar is expected for the first six months.  After six months, the redness will fade and the scar will soften and fade.  The color difference becomes less noticeable with time.  If there are any problems, return for a post-op surgery check at your earliest convenience.  To improve the appearance of the scar, you can use silicone scar  gel, cream, or sheets (such as Mederma or Serica) every night for up to one year. These are available over the counter (without a prescription).  Please call our office at [phone] for any questions or concerns.   Melanoma ABCDEs  Melanoma is the most dangerous type of skin cancer, and is the leading cause of death from skin disease.  You are more likely to develop melanoma if you: Have light-colored skin, light-colored eyes, or red or blond hair Spend a lot of time in the sun Tan regularly, either outdoors or in a tanning bed Have had blistering sunburns, especially during childhood Have a close family member who has had a melanoma Have atypical moles or large birthmarks  Early detection of melanoma is key since treatment is typically straightforward and cure rates are extremely high if we catch it early.   The first sign of melanoma is often a change in a mole or a new dark spot.  The ABCDE system is a way of remembering the signs of melanoma.  A for asymmetry:  The two halves do not match. B for border:  The edges of the growth are irregular. C for color:  A mixture of colors are present instead of an even brown color. D for diameter:  Melanomas are usually (but not always) greater than 54mm - the size of a pencil eraser. E for evolution:  The spot keeps changing in size, shape, and color.  Please check your skin once per month between visits. You can use a small mirror in front and a large mirror behind you to keep an eye on the back side or your body.   If you see any new or changing lesions before your next follow-up, please call to schedule a visit.  Please continue daily skin protection including broad spectrum sunscreen SPF 30+ to sun-exposed areas, reapplying every 2 hours as needed when you're outdoors.

## 2021-04-13 ENCOUNTER — Telehealth: Payer: Self-pay

## 2021-04-13 NOTE — Telephone Encounter (Signed)
Left pt msg to call for bx result/sh °

## 2021-04-13 NOTE — Telephone Encounter (Signed)
Patient advised of BX results.  °

## 2021-04-13 NOTE — Telephone Encounter (Signed)
-----   Message from Brendolyn Patty, MD sent at 04/13/2021  8:46 AM EDT ----- Skin , left shoulder NEUROFIBROMA, BASE INVOLVED  Benign NF, may recur - please call patient

## 2021-07-06 DIAGNOSIS — S46911A Strain of unspecified muscle, fascia and tendon at shoulder and upper arm level, right arm, initial encounter: Secondary | ICD-10-CM | POA: Insufficient documentation

## 2021-07-16 ENCOUNTER — Other Ambulatory Visit: Payer: Self-pay | Admitting: Physician Assistant

## 2021-07-16 DIAGNOSIS — S29012S Strain of muscle and tendon of back wall of thorax, sequela: Secondary | ICD-10-CM

## 2021-07-16 DIAGNOSIS — M549 Dorsalgia, unspecified: Secondary | ICD-10-CM

## 2021-07-19 ENCOUNTER — Other Ambulatory Visit: Payer: Self-pay

## 2021-07-19 ENCOUNTER — Ambulatory Visit
Admission: RE | Admit: 2021-07-19 | Discharge: 2021-07-19 | Disposition: A | Discharge status: Home / Self Care | Payer: Medicare Other | Source: Ambulatory Visit | Attending: Physician Assistant | Admitting: Physician Assistant

## 2021-07-19 DIAGNOSIS — S29012S Strain of muscle and tendon of back wall of thorax, sequela: Secondary | ICD-10-CM | POA: Diagnosis present

## 2021-07-19 DIAGNOSIS — M549 Dorsalgia, unspecified: Secondary | ICD-10-CM

## 2021-07-21 ENCOUNTER — Other Ambulatory Visit: Payer: Self-pay | Admitting: Physician Assistant

## 2021-07-21 DIAGNOSIS — R2230 Localized swelling, mass and lump, unspecified upper limb: Secondary | ICD-10-CM

## 2021-07-23 ENCOUNTER — Other Ambulatory Visit: Payer: Self-pay | Admitting: Internal Medicine

## 2021-07-23 DIAGNOSIS — R0789 Other chest pain: Secondary | ICD-10-CM

## 2021-07-23 DIAGNOSIS — M898X1 Other specified disorders of bone, shoulder: Secondary | ICD-10-CM

## 2021-07-27 ENCOUNTER — Ambulatory Visit: Payer: Medicare Other

## 2021-08-06 ENCOUNTER — Other Ambulatory Visit: Payer: Self-pay

## 2021-08-06 ENCOUNTER — Ambulatory Visit
Admission: RE | Admit: 2021-08-06 | Discharge: 2021-08-06 | Disposition: A | Discharge status: Home / Self Care | Payer: Medicare Other | Source: Ambulatory Visit | Attending: Internal Medicine | Admitting: Internal Medicine

## 2021-08-06 DIAGNOSIS — M898X1 Other specified disorders of bone, shoulder: Secondary | ICD-10-CM | POA: Insufficient documentation

## 2021-08-06 DIAGNOSIS — R0789 Other chest pain: Secondary | ICD-10-CM | POA: Insufficient documentation

## 2021-08-06 LAB — POCT I-STAT CREATININE: Creatinine, Ser: 1.3 mg/dL — ABNORMAL HIGH (ref 0.61–1.24)

## 2021-08-06 MED ORDER — IOHEXOL 300 MG/ML  SOLN
75.0000 mL | Freq: Once | INTRAMUSCULAR | Status: AC | PRN
  Administered 2021-08-06: 75 mL via INTRAVENOUS

## 2021-08-19 ENCOUNTER — Encounter (HOSPITAL_COMMUNITY): Payer: Self-pay | Admitting: Radiology

## 2021-08-25 ENCOUNTER — Other Ambulatory Visit: Payer: Self-pay

## 2021-08-25 ENCOUNTER — Ambulatory Visit: Payer: Medicare Other | Admitting: Dermatology

## 2021-08-25 DIAGNOSIS — L820 Inflamed seborrheic keratosis: Secondary | ICD-10-CM | POA: Diagnosis not present

## 2021-08-25 DIAGNOSIS — L905 Scar conditions and fibrosis of skin: Secondary | ICD-10-CM

## 2021-08-25 DIAGNOSIS — L910 Hypertrophic scar: Secondary | ICD-10-CM | POA: Diagnosis not present

## 2021-08-25 DIAGNOSIS — D3610 Benign neoplasm of peripheral nerves and autonomic nervous system, unspecified: Secondary | ICD-10-CM

## 2021-08-25 DIAGNOSIS — D3617 Benign neoplasm of peripheral nerves and autonomic nervous system of trunk, unspecified: Secondary | ICD-10-CM

## 2021-08-25 NOTE — Patient Instructions (Addendum)
Recommend Serica moisturizing scar formula cream every night or [facility] brand or Mederma silicone scar sheet every night for the first year after a scar appears to help with scar remodeling if desired. Scars remodel on their own for a full year.    Seborrheic Keratosis  What causes seborrheic keratoses? Seborrheic keratoses are harmless, common skin growths that first appear during adult life.  As time goes by, more growths appear.  Some people may develop a large number of them.  Seborrheic keratoses appear on both covered and uncovered body parts.  They are not caused by sunlight.  The tendency to develop seborrheic keratoses can be inherited.  They vary in color from skin-colored to gray, brown, or even black.  They can be either smooth or have a rough, warty surface.   Seborrheic keratoses are superficial and look as if they were stuck on the skin.  Under the microscope this type of keratosis looks like layers upon layers of skin.  That is why at times the top layer may seem to fall off, but the rest of the growth remains and re-grows.    Treatment Seborrheic keratoses do not need to be treated, but can easily be removed in the office.  Seborrheic keratoses often cause symptoms when they rub on clothing or jewelry.  Lesions can be in the way of shaving.  If they become inflamed, they can cause itching, soreness, or burning.  Removal of a seborrheic keratosis can be accomplished by freezing, burning, or surgery. If any spot bleeds, scabs, or grows rapidly, please return to have it checked, as these can be an indication of a skin cancer.  Cryotherapy Aftercare  Wash gently with soap and water everyday.   Apply Vaseline and Band-Aid daily until healed.      If you have any questions or concerns for your doctor, please call our main line at [phone] and press option 4 to reach your doctor's medical assistant. If no one answers, please leave a voicemail as directed and we will return your  call as soon as possible. Messages left after 4 pm will be answered the following business day.   You may also send [place] a message via [place]. We typically respond to MyChart messages within 1-2 business days.  For prescription refills, please ask your pharmacy to contact our office. Our fax number is [phone].  If you have an urgent issue when the clinic is closed that cannot wait until the next business day, you can page your doctor at the number below.    Please note that while we do our best to be available for urgent issues outside of office hours, we are not available 24/7.   If you have an urgent issue and are unable to reach [place], you may choose to seek medical care at your doctor's office, retail clinic, urgent care center, or emergency room.  If you have a medical emergency, please immediately call 911 or go to the emergency department.  Pager Numbers  - Dr. Nehemiah Massed: [phone]  - Dr. Laurence Ferrari: [phone]  - Dr. Nicole Kindred: [phone]  In the event of inclement weather, please call our main line at [phone] for an update on the status of any delays or closures.  Dermatology Medication Tips: Please keep the boxes that topical medications come in in order to help keep track of the instructions about where and how to use these. Pharmacies typically print the medication instructions only on the boxes and not directly on the medication tubes.  If your medication is too expensive, please contact our office at [phone] option 4 or send [place] a message through [place].   We are unable to tell what your co-pay for medications will be in advance as this is different depending on your insurance coverage. However, we may be able to find a substitute medication at lower cost or fill out paperwork to get insurance to cover a needed medication.   If a prior authorization is required to get your medication covered by your insurance company, please allow [place] 1-2 business days to  complete this process.  Drug prices often vary depending on where the prescription is filled and some pharmacies may offer cheaper prices.  The website www.goodrx.com contains coupons for medications through different pharmacies. The prices here do not account for what the cost may be with help from insurance (it may be cheaper with your insurance), but the website can give you the price if you did not use any insurance.  - You can print the associated coupon and take it with your prescription to the pharmacy.  - You may also stop by our office during regular business hours and pick up a GoodRx coupon card.  - If you need your prescription sent electronically to a different pharmacy, notify our office through [facility] or by phone at [phone] option 4.

## 2021-08-25 NOTE — Progress Notes (Signed)
   Follow-Up Visit   Subjective  Keith Gordon is a 80 y.o. male who presents for the following: Follow-up (Patient here today for a sore on left hip. Reports had surgery years ago and noticed a sore on scar. Patient reports sore at mid back that hurts to touch. Patient is unsure how long spot has been there. ). Sore spot at shoulder h/o shave removal of mole  The following portions of the chart were reviewed this encounter and updated as appropriate:      Review of Systems: No other skin or systemic complaints except as noted in HPI or Assessment and Plan.   Objective  Well appearing patient in no apparent distress; mood and affect are within normal limits.  A focused examination was performed including left hip, left lower back. Relevant physical exam findings are noted in the Assessment and Plan.  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  Discussed surgical excision to remove, including resulting scar and possible recurrence.    Patient would like to wait on having removed at this time.  Will continue to observe.    Inflamed seborrheic keratosis left hip x 1  Destruction of lesion - left hip x 1  Destruction method: cryotherapy   Informed consent: discussed and consent obtained   Lesion destroyed using liquid nitrogen: Yes   Region frozen until ice ball extended beyond lesion: Yes   Outcome: patient tolerated procedure well with no complications   Post-procedure details: wound care instructions given   Additional details:  Prior to procedure, discussed risks of blister formation, small wound, skin dyspigmentation, or rare scar following cryotherapy. Recommend Vaseline ointment to treated areas while healing.   Scar Left Shoulder -  Anterior  Hypertrophic, s/p shave removal of benign nevus Benign, observe.    May apply OTC Serica scar gel bid to thickened scar. Sample given  Return for 2 months follow up on left hip. I, Ruthell Rummage, CMA, am acting as scribe for Brendolyn Patty, MD.  Documentation: I have reviewed the above documentation for accuracy and completeness, and I agree with the above.  Brendolyn Patty MD

## 2021-10-21 ENCOUNTER — Ambulatory Visit: Payer: Medicare Other | Admitting: Physician Assistant

## 2021-10-21 ENCOUNTER — Other Ambulatory Visit: Payer: Self-pay

## 2021-10-21 ENCOUNTER — Encounter: Payer: Self-pay | Admitting: Physician Assistant

## 2021-10-21 VITALS — BP 118/74 | HR 76 | Ht 74.0 in | Wt 203.0 lb

## 2021-10-21 DIAGNOSIS — L723 Sebaceous cyst: Secondary | ICD-10-CM | POA: Diagnosis not present

## 2021-10-21 LAB — URINALYSIS, COMPLETE
Bilirubin, UA: NEGATIVE
Glucose, UA: NEGATIVE
Ketones, UA: NEGATIVE
Leukocytes,UA: NEGATIVE
Nitrite, UA: NEGATIVE
Protein,UA: NEGATIVE
RBC, UA: NEGATIVE
Specific Gravity, UA: 1.015 (ref 1.005–1.030)
Urobilinogen, Ur: 0.2 mg/dL (ref 0.2–1.0)
pH, UA: 5.5 (ref 5.0–7.5)

## 2021-10-21 LAB — MICROSCOPIC EXAMINATION: Bacteria, UA: NONE SEEN

## 2021-10-21 MED ORDER — DOXYCYCLINE HYCLATE 100 MG PO CAPS: 100.0000 mg | ORAL_CAPSULE | Freq: Two times a day (BID) | ORAL | 0 refills | Status: AC

## 2021-10-23 NOTE — Progress Notes (Signed)
10/21/2021 4:22 PM   Keith Gordon 1941-02-24 [id number]  CC: Chief Complaint  Patient presents with   Other    Scrotal rash    HPI: Keith Gordon is a 80 y.o. male with PMH nodular prostate, and BPH with LUTS who presents today for evaluation of left inguinal bumps.   Today he reports an approximate 1 month history of mildly tender bumps along his inguinal creases.  The started on the right side and spontaneously resolved, and now there appears to be 1-2 of them on the left.  He notes no drainage from these bumps.  He has been using clobetasol and triamcinolone creams topically with minimal improvement.  In-office UA and microscopy today pan negative.  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   Dysrhythmia    Family history of adverse reaction to anesthesia    son severe nausea and vomiting   Pneumonia 1960's   history of   PONV (postoperative nausea and vomiting)     Surgical History: Past Surgical History:  Procedure Laterality Date   APPENDECTOMY  1968   CHOLECYSTECTOMY  2001   COLONOSCOPY WITH PROPOFOL N/A 04/19/2018   Procedure: COLONOSCOPY WITH PROPOFOL;  Surgeon: Toledo, Benay Pike, MD;  Location: [facility] ENDOSCOPY;  Service: Endoscopy;  Laterality: N/A;   EYE SURGERY Right 2001   tear duct   OPEN SURGICAL REPAIR OF GLUTEAL TENDON Left 02/15/2017   Procedure: PRIMARY REPAIR OF THE GLUTEUS MEDIUS TENDON OF HIP;  Surgeon: Corky Mull, MD;  Location: [facility] ORS;  Service: Orthopedics;  Laterality: Left;   [place] Medications:  Allergies as of 10/21/2021       Reactions   Demerol [meperidine] Nausea And Vomiting   Dust Mite Extract Other (See Comments)   Levofloxacin Other (See Comments)   Spots on legs    Sulfa Antibiotics Rash        Medication List        Accurate as of  October 21, 2021 11:59 PM. If you have any questions, ask your nurse or doctor.          doxycycline 100 MG capsule Commonly known as: VIBRAMYCIN Take 1 capsule (100 mg total) by mouth 2 (two) times daily for 7 days. Started by: Debroah Loop, PA-C        Allergies:  Allergies  Allergen Reactions   Demerol [Meperidine] Nausea And Vomiting   Dust Mite Extract Other (See Comments)   Levofloxacin Other (See Comments)    Spots on legs    Sulfa Antibiotics Rash    Family History: No family history on file.  Social History:   reports that he quit smoking about 56 years ago. His smoking use included cigarettes. He has never used smokeless tobacco. He reports that he does not drink alcohol and does not use drugs.  Physical Exam: BP 118/74 (BP Location: Left Arm, Patient Position: Sitting, Cuff Size: Large)    Pulse 76    Ht 6\' 2"  (1.88 m)    Wt 203 lb (92.1 kg)    BMI 26.06 kg/m   Constitutional:  Alert and oriented, no acute distress, nontoxic appearing HEENT: [place], AT Cardiovascular: No clubbing, cyanosis, or edema Respiratory: Normal respiratory effort, no increased work of breathing  GU: Mild erythema over the left inguinal crease.  1-2 small, firm, mildly tender nodules within the crease without fluctuance, crepitus, or purulence. Skin: No rashes, bruises or suspicious lesions Neurologic: Grossly intact, no focal deficits, moving all 4 extremities Psychiatric: Normal mood and affect  Laboratory Data: Results for orders placed or performed in visit on 10/21/21  Microscopic Examination   Urine  Result Value Ref Range   WBC, UA 0-5 0 - 5 /hpf   RBC 0-2 0 - 2 /hpf   Epithelial Cells (non renal) 0-10 0 - 10 /hpf   Bacteria, UA None seen None seen/Few  Urinalysis, Complete  Result Value Ref Range   Specific Gravity, UA 1.015 1.005 - 1.030   pH, UA 5.5 5.0 - 7.5   Color, UA Yellow Yellow   Appearance Ur Clear Clear   Leukocytes,UA Negative Negative   Protein,UA  Negative Negative/Trace   Glucose, UA Negative Negative   Ketones, UA Negative Negative   RBC, UA Negative Negative   Bilirubin, UA Negative Negative   Urobilinogen, Ur 0.2 0.2 - 1.0 mg/dL   Nitrite, UA Negative Negative   Microscopic Examination See below:    Assessment & Plan:   1. Inflamed sebaceous cyst Dr. Bernardo Heater and I both examined the patient today and felt that the bumps are likely consistent with inflamed sebaceous cysts, as they are likely too inferior to represent inguinal lymphadenopathy.  We will treat with empiric doxycycline on counseled patient to return to clinic with persistent symptoms.  As for the inguinal erythema, this is very mild and likely secondary to the inflamed cysts and we counseled him to follow-up with dermatology with any persistent symptoms. - Urinalysis, Complete - doxycycline (VIBRAMYCIN) 100 MG capsule; Take 1 capsule (100 mg total) by mouth 2 (two) times daily for 7 days.  Dispense: 14 capsule; Refill: 0  Return if symptoms worsen or fail to improve.  Debroah Loop, PA-C  [facility] [address] [place], [place] [address] [phone]

## 2021-10-29 ENCOUNTER — Encounter: Payer: Self-pay | Admitting: Dermatology

## 2021-11-02 ENCOUNTER — Ambulatory Visit: Payer: Medicare Other | Admitting: Dermatology

## 2021-11-02 ENCOUNTER — Other Ambulatory Visit: Payer: Self-pay

## 2021-11-02 DIAGNOSIS — L304 Erythema intertrigo: Secondary | ICD-10-CM | POA: Diagnosis not present

## 2021-11-02 DIAGNOSIS — L853 Xerosis cutis: Secondary | ICD-10-CM

## 2021-11-02 DIAGNOSIS — L905 Scar conditions and fibrosis of skin: Secondary | ICD-10-CM | POA: Diagnosis not present

## 2021-11-02 DIAGNOSIS — I8393 Asymptomatic varicose veins of bilateral lower extremities: Secondary | ICD-10-CM | POA: Diagnosis not present

## 2021-11-02 MED ORDER — KETOCONAZOLE 2 % EX CREA: 1.0000 "application " | TOPICAL_CREAM | CUTANEOUS | 2 refills | Status: AC

## 2021-11-02 MED ORDER — PIMECROLIMUS 1 % EX CREA
TOPICAL_CREAM | CUTANEOUS | 2 refills | Status: AC
Start: 2021-11-02 — End: ?

## 2021-11-02 NOTE — Patient Instructions (Addendum)
If You Need Anything After Your Visit ° °If you have any questions or concerns for your doctor, please call our main line at [phone] and press option 4 to reach your doctor's medical assistant. If no one answers, please leave a voicemail as directed and we will return your call as soon as possible. Messages left after 4 pm will be answered the following business day.  ° °You may also send us a message via MyChart. We typically respond to MyChart messages within 1-2 business days. ° °For prescription refills, please ask your pharmacy to contact our office. Our fax number is [phone]. ° °If you have an urgent issue when the clinic is closed that cannot wait until the next business day, you can page your doctor at the number below.   ° °Please note that while we do our best to be available for urgent issues outside of office hours, we are not available 24/7.  ° °If you have an urgent issue and are unable to reach us, you may choose to seek medical care at your doctor's office, retail clinic, urgent care center, or emergency room. ° °If you have a medical emergency, please immediately call 911 or go to the emergency department. ° °Pager Numbers ° °- Dr. Kowalski: [phone] ° °- Dr. Moye: [phone] ° °- Dr. Stewart: [phone] ° °In the event of inclement weather, please call our main line at [phone] for an update on the status of any delays or closures. ° °Dermatology Medication Tips: °Please keep the boxes that topical medications come in in order to help keep track of the instructions about where and how to use these. Pharmacies typically print the medication instructions only on the boxes and not directly on the medication tubes.  ° °If your medication is too expensive, please contact our office at [phone] option 4 or send us a message through MyChart.  ° °We are unable to tell what your co-pay for medications will be in advance as this is different depending on your insurance coverage.  However, we may be able to find a substitute medication at lower cost or fill out paperwork to get insurance to cover a needed medication.  ° °If a prior authorization is required to get your medication covered by your insurance company, please allow us 1-2 business days to complete this process. ° °Drug prices often vary depending on where the prescription is filled and some pharmacies may offer cheaper prices. ° °The website www.goodrx.com contains coupons for medications through different pharmacies. The prices here do not account for what the cost may be with help from insurance (it may be cheaper with your insurance), but the website can give you the price if you did not use any insurance.  °- You can print the associated coupon and take it with your prescription to the pharmacy.  °- You may also stop by our office during regular business hours and pick up a GoodRx coupon card.  °- If you need your prescription sent electronically to a different pharmacy, notify our office through [place] MyChart or by phone at [phone] option 4. ° ° ° ° °Si Usted Necesita Algo Después de Su Visita ° °También puede enviarnos un mensaje a través de MyChart. Por lo general respondemos a los mensajes de MyChart en el transcurso de 1 a 2 días hábiles. ° °Para renovar recetas, por favor pida a su farmacia que se ponga en contacto con nuestra oficina. Nuestro número de fax es el [phone]. ° °Si tiene   un asunto urgente cuando la clnica est cerrada y que no puede esperar hasta el siguiente da hbil, puede llamar/localizar a su doctor(a) al nmero que aparece a continuacin.   Por favor, tenga en cuenta que aunque hacemos todo lo posible para estar disponibles para asuntos urgentes fuera del horario de [place], no estamos disponibles las 24 horas del da, los 7 das de la [place].   Si tiene un problema urgente y no puede comunicarse con nosotros, puede optar por buscar atencin mdica  en el consultorio de su  doctor(a), en una clnica privada, en un centro de atencin urgente o en una sala de emergencias.  Si tiene Engineering geologist, por favor llame inmediatamente al 911 o vaya a la sala de emergencias.  Nmeros de bper  - Dr. Nehemiah Massed: [phone]  - Dra. Moye: [phone]  - Dra. Nicole Kindred: [phone]  En caso de inclemencias del [place], por favor llame a Johnsie Kindred principal al [phone] para una actualizacin sobre el [place] de cualquier retraso o cierre.  Consejos para la medicacin en dermatologa: Por favor, guarde las cajas en las que vienen los medicamentos de uso tpico para ayudarle a seguir las instrucciones sobre dnde y cmo usarlos. Las farmacias generalmente imprimen las instrucciones del medicamento slo en las cajas y no directamente en los tubos del [place].   Si su medicamento es muy caro, por favor, pngase en contacto con Zigmund Daniel llamando al [phone] y presione la opcin 4 o envenos un mensaje a travs de Pharmacist, community.   No podemos decirle cul ser su copago por los medicamentos por adelantado ya que esto es diferente dependiendo de la cobertura de su seguro. Sin embargo, es posible que podamos encontrar un medicamento sustituto a Electrical engineer un formulario para que el seguro cubra el medicamento que se considera necesario.   Si se requiere una autorizacin previa para que su compaa de seguros [place] su medicamento, por favor permtanos de 1 a 2 das hbiles para completar este proceso.  Los precios de los medicamentos varan con frecuencia dependiendo del Environmental consultant de dnde se surte la receta y alguna farmacias pueden ofrecer precios ms baratos.  El sitio web www.goodrx.com tiene cupones para medicamentos de Airline pilot. Los precios aqu no tienen en cuenta lo que podra costar con la ayuda del seguro (puede ser ms barato con su seguro), pero el sitio web puede darle el precio si no utiliz Research scientist (physical sciences).  - Puede imprimir el cupn  correspondiente y llevarlo con su receta a la farmacia.  - Tambin puede pasar por nuestra oficina durante el horario de atencin regular y Charity fundraiser una tarjeta de cupones de GoodRx.  - Si necesita que su receta se enve electrnicamente a una farmacia diferente, informe a nuestra oficina a travs de MyChart de [place] o por telfono llamando al [phone] y presione la opcin 4.   Recommend Cetaphil cream or Cerave cream for moisturizer daily after bath. Start [employer] for sensitive skin.  Gentle Skin Care Guide  1. Bathe no more than once a day.  2. Avoid bathing in hot water  3. Use a mild soap like Dove, Vanicream, Cetaphil, CeraVe. Can use Lever 2000 or Cetaphil antibacterial soap  4. Use soap only where you need it. On most days, use it under your arms, between your legs, and on your feet. Let the water rinse other areas unless visibly dirty.  5. When you get out of the bath/shower, use a towel to gently blot your skin  dry, don't rub it.  6. While your skin is still a little damp, apply a moisturizing cream such as Vanicream, CeraVe, Cetaphil, Eucerin, Sarna lotion or plain Vaseline Jelly. For hands apply Neutrogena [place] Hand Cream or Excipial Hand Cream.  7. Reapply moisturizer any time you start to itch or feel dry.  8. Sometimes using free and clear laundry detergents can be helpful. Fabric softener sheets should be avoided. Downy Free & Gentle liquid, or any liquid fabric softener that is free of dyes and perfumes, it acceptable to use  9. If your doctor has given you prescription creams you may apply moisturizers over them

## 2021-11-02 NOTE — Progress Notes (Signed)
° °  Follow-Up Visit   Subjective  Keith Gordon is a 81 y.o. male who presents for the following: rash (L leg, groin, used TMC cr, Clobetasol cr and Lamisil cr not helping, pt saw urologist last week for knots in groin and they put him on Doxycycline 100mg  bid x 7 days and not helping) and Hx of ISK not resolved (L hip, LN2 08/25/21 itchy).   The following portions of the chart were reviewed this encounter and updated as appropriate:       Review of Systems:  No other skin or systemic complaints except as noted in HPI or Assessment and Plan.  Objective  Well appearing patient in no apparent distress; mood and affect are within normal limits.  A focused examination was performed including left hip, groin, left leg. Relevant physical exam findings are noted in the Assessment and Plan.  L hip Violaceous patch in area of previously treated ISK, overlying old scar  groin, L popliteal Pink brown discoloration with skin atrophy BL medial inguinal crease groin, L popliteal     Assessment & Plan   Varicose Veins/Spider Veins - Dilated nodular bluish veins at the L medial knee, L groin - Reassured "knots" in groin appear to be varicosities. - Smaller vessels can be treated by sclerotherapy (a procedure to inject a medicine into the veins to make them disappear) if desired, but the treatment is not covered by insurance. Larger vessels may be covered if symptomatic and we would refer to vascular surgeon if treatment desired.  -  Xerosis - diffuse xerotic patches - recommend gentle, hydrating skin care - gentle skin care handout given - recommend cerave cream, cetaphil cream - recommend Dove for sensitive skin soap  Scar L hip  Benign, observe  Intertrigo groin, L popliteal  With skin atrophy from long term topical steroid use  Intertrigo is a chronic recurrent rash that occurs in skin fold areas that may be associated with friction; heat; moisture; yeast; fungus; and  bacteria.  It is exacerbated by increased movement / activity; sweating; and higher atmospheric temperature.  D/C Clobetasol cr D/C TMC cr  Start Elidel cr qd/bid aa itchy rash prn flares Start Ketoconazole 2% cr qd/bid aa itchy rash groin prn flares  pimecrolimus (ELIDEL) 1 % cream - groin, L popliteal Apply topically as directed. Qd to bid aa itchy rash groin, L popliteal prn flares  ketoconazole (NIZORAL) 2 % cream - groin, L popliteal Apply 1 application topically as directed. Qd to bid aa itchy rash groin, L popliteal prn flares   Return in about 6 months (around 05/02/2022) for f/u Intertrigo, TBSE.  I, Othelia Pulling, RMA, am acting as scribe for Brendolyn Patty, MD .  Documentation: I have reviewed the above documentation for accuracy and completeness, and I agree with the above.  Brendolyn Patty MD

## 2021-11-03 ENCOUNTER — Telehealth: Payer: Self-pay

## 2021-11-03 NOTE — Telephone Encounter (Signed)
Patient's Pimecrolimus PA denied. Patient must try 2 of the following step therapies.  -Hydrocortisone 2.5% Cream -Augmented Betamethasone 0.05% -Desonide Ointment -Fluocinonide 0.05% -Hydrocortisone 2.5% Ointment

## 2021-11-05 NOTE — Telephone Encounter (Signed)
Appeal submitted yesterday to insurance. aw

## 2021-11-09 ENCOUNTER — Ambulatory Visit: Payer: Medicare Other | Admitting: Dermatology

## 2021-11-09 NOTE — Telephone Encounter (Signed)
Spoke with patient today. Advised him I do not have a status update regarding appeal at this time. aw

## 2021-11-12 NOTE — Telephone Encounter (Signed)
Appeal has been approved.  Called and left voicemail for patient to return my call. aw

## 2021-11-12 NOTE — Telephone Encounter (Signed)
Patient advised. aw 

## 2021-12-30 DIAGNOSIS — K5730 Diverticulosis of large intestine without perforation or abscess without bleeding: Secondary | ICD-10-CM | POA: Insufficient documentation

## 2021-12-30 DIAGNOSIS — I700 Atherosclerosis of aorta: Secondary | ICD-10-CM | POA: Insufficient documentation

## 2021-12-30 DIAGNOSIS — I2510 Atherosclerotic heart disease of native coronary artery without angina pectoris: Secondary | ICD-10-CM | POA: Insufficient documentation

## 2022-04-13 ENCOUNTER — Ambulatory Visit (INDEPENDENT_AMBULATORY_CARE_PROVIDER_SITE_OTHER): Payer: Medicare Other | Admitting: Dermatology

## 2022-04-13 DIAGNOSIS — L820 Inflamed seborrheic keratosis: Secondary | ICD-10-CM | POA: Diagnosis not present

## 2022-04-13 DIAGNOSIS — L304 Erythema intertrigo: Secondary | ICD-10-CM | POA: Diagnosis not present

## 2022-04-13 DIAGNOSIS — D3612 Benign neoplasm of peripheral nerves and autonomic nervous system, upper limb, including shoulder: Secondary | ICD-10-CM | POA: Diagnosis not present

## 2022-04-13 DIAGNOSIS — D225 Melanocytic nevi of trunk: Secondary | ICD-10-CM

## 2022-04-13 DIAGNOSIS — Z1283 Encounter for screening for malignant neoplasm of skin: Secondary | ICD-10-CM | POA: Diagnosis not present

## 2022-04-13 DIAGNOSIS — D229 Melanocytic nevi, unspecified: Secondary | ICD-10-CM

## 2022-04-13 DIAGNOSIS — L570 Actinic keratosis: Secondary | ICD-10-CM | POA: Diagnosis not present

## 2022-04-13 DIAGNOSIS — Z85828 Personal history of other malignant neoplasm of skin: Secondary | ICD-10-CM

## 2022-04-13 DIAGNOSIS — D2239 Melanocytic nevi of other parts of face: Secondary | ICD-10-CM

## 2022-04-13 DIAGNOSIS — D3610 Benign neoplasm of peripheral nerves and autonomic nervous system, unspecified: Secondary | ICD-10-CM

## 2022-04-13 DIAGNOSIS — Z86018 Personal history of other benign neoplasm: Secondary | ICD-10-CM

## 2022-04-13 DIAGNOSIS — L910 Hypertrophic scar: Secondary | ICD-10-CM

## 2022-04-13 NOTE — Progress Notes (Signed)
Follow-Up Visit   Subjective  Keith Gordon is a 81 y.o. male who presents for the following: Annual Exam (Patient has hx of intertrigo at groin, has ketoconazole cream and elidil cream to use when flared. Reports has not had to use and using Vaseline instead. He also has hx of bcc, hx of neurofibroma , hx of aks. ).  The patient presents for Total-Body Skin Exam (TBSE) for skin cancer screening and mole check.  The patient has spots, moles and lesions to be evaluated, some may be new or changing and the patient has concerns that these could be cancer.   The following portions of the chart were reviewed this encounter and updated as appropriate:      Review of Systems: No other skin or systemic complaints except as noted in HPI or Assessment and Plan.   Objective  Well appearing patient in no apparent distress; mood and affect are within normal limits.  A full examination was performed including scalp, head, eyes, ears, nose, lips, neck, chest, axillae, abdomen, back, buttocks, bilateral upper extremities, bilateral lower extremities, hands, feet, fingers, toes, fingernails, and toenails. All findings within normal limits unless otherwise noted below.  left forearm Firm keratotic papule  left lateral forehead x 1 Erythematous thin papules/macules with gritty scale.   left abdomen 4 mm brown papule with lighter center   right interior chin 6 mm pink flesh papule   groin and left popliteal Pt not examined today- states clear  Right Shoulder - Anterior Soft flesh papule  left shoulder 0.9 cm firm pink flesh nodule with thickened scar    Assessment & Plan  Inflamed seborrheic keratosis left forearm  Isk vs prurigo nodule   Discussed treatment. Patient deferred treatment at this time.  Avoid picking. Benign, observe.   Actinic keratosis left lateral forehead x 1  Actinic keratoses are precancerous spots that appear secondary to cumulative UV radiation  exposure/sun exposure over time. They are chronic with expected duration over 1 year. A portion of actinic keratoses will progress to squamous cell carcinoma of the skin. It is not possible to reliably predict which spots will progress to skin cancer and so treatment is recommended to prevent development of skin cancer.  Recommend daily broad spectrum sunscreen SPF 30+ to sun-exposed areas, reapply every 2 hours as needed.  Recommend staying in the shade or wearing long sleeves, sun glasses (UVA+UVB protection) and wide brim hats (4-inch brim around the entire circumference of the hat). Call for new or changing lesions.  Destruction of lesion - left lateral forehead x 1  Destruction method: cryotherapy   Informed consent: discussed and consent obtained   Lesion destroyed using liquid nitrogen: Yes   Region frozen until ice ball extended beyond lesion: Yes   Outcome: patient tolerated procedure well with no complications   Post-procedure details: wound care instructions given   Additional details:  Prior to procedure, discussed risks of blister formation, small wound, skin dyspigmentation, or rare scar following cryotherapy. Recommend Vaseline ointment to treated areas while healing.   Nevus (2) left abdomen; right interior chin  Benign-appearing.  Observation.  Call clinic for new or changing lesions.  Recommend daily use of broad spectrum spf 30+ sunscreen to sun-exposed areas.   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 Currently well-controlled.  Intertrigo is a chronic recurrent rash that occurs in skin fold areas that may  be associated with friction; heat; moisture; yeast; fungus; and bacteria.  It is exacerbated by increased movement / activity; sweating; and higher atmospheric temperature.    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   Benign. Observe.  Lentigines - Scattered tan macules - Due to sun exposure - Benign-appearing, observe - Recommend daily broad spectrum sunscreen SPF 30+ to sun-exposed areas, reapply every 2 hours as needed. - Call for any changes  Sebaceous Hyperplasia - Small yellow papules with a central dell - Benign - Observe  Seborrheic Keratoses - Stuck-on, waxy, tan-brown papules and/or plaques  - Benign-appearing - Discussed benign etiology and prognosis. - Observe - Call for any changes  Melanocytic Nevi - Tan-brown and/or pink-flesh-colored symmetric macules and papules - Benign appearing on exam today - Observation - Call clinic for new or changing moles - Recommend daily use of broad spectrum spf 30+ sunscreen to sun-exposed areas.   Hemangiomas - Red papules - Discussed benign nature - Observe - Call for any changes  Actinic Damage - Chronic condition, secondary to cumulative UV/sun exposure - diffuse scaly erythematous macules with underlying dyspigmentation - Recommend daily broad spectrum sunscreen SPF 30+ to sun-exposed areas, reapply every 2 hours as needed.  - Staying in the shade or wearing long sleeves, sun glasses (UVA+UVB protection) and wide brim hats (4-inch brim around the entire circumference of the hat) are also recommended for sun protection.  - Call for new or changing lesions.  History of Basal Cell Carcinoma of the Skin - No evidence of recurrence today at left ear  - Recommend regular full body skin exams - Recommend daily broad spectrum sunscreen SPF 30+ to  sun-exposed areas, reapply every 2 hours as needed.  - Call if any new or changing lesions are noted between office visits  Skin cancer screening performed today. Return in about 1 year (around 04/14/2023) for TBSE. I, Ruthell Rummage, CMA, am acting as scribe for Brendolyn Patty, MD.  Documentation: I have reviewed the above documentation for accuracy and completeness, and I agree with the above.  Brendolyn Patty MD

## 2022-04-13 NOTE — Patient Instructions (Addendum)
For intertrigo  Can continue Elidil cream to affected areas when flared apply to groin Can continue vaseline to affected areas of groin for rash Can use Zeasorb Af powder - apply after shower to help with moisture. Can be found at [facility] in the Antifungal section Can continue ketoconazole cream to affected area when flared.   Actinic keratoses are precancerous spots that appear secondary to cumulative UV radiation exposure/sun exposure over time. They are chronic with expected duration over 1 year. A portion of actinic keratoses will progress to squamous cell carcinoma of the skin. It is not possible to reliably predict which spots will progress to skin cancer and so treatment is recommended to prevent development of skin cancer.  Recommend daily broad spectrum sunscreen SPF 30+ to sun-exposed areas, reapply every 2 hours as needed.  Recommend staying in the shade or wearing long sleeves, sun glasses (UVA+UVB protection) and wide brim hats (4-inch brim around the entire circumference of the hat). Call for new or changing lesions.   Cryotherapy Aftercare  Wash gently with soap and water everyday.   Apply Vaseline and Band-Aid daily until healed.     Melanoma ABCDEs  Melanoma is the most dangerous type of skin cancer, and is the leading cause of death from skin disease.  You are more likely to develop melanoma if you: Have light-colored skin, light-colored eyes, or red or blond hair Spend a lot of time in the sun Tan regularly, either outdoors or in a tanning bed Have had blistering sunburns, especially during childhood Have a close family member who has had a melanoma Have atypical moles or large birthmarks  Early detection of melanoma is key since treatment is typically straightforward and cure rates are extremely high if we catch it early.   The first sign of melanoma is often a change in a mole or a new dark spot.  The ABCDE system is a way of remembering the signs of  melanoma.  A for asymmetry:  The two halves do not match. B for border:  The edges of the growth are irregular. C for color:  A mixture of colors are present instead of an even brown color. D for diameter:  Melanomas are usually (but not always) greater than 62m - the size of a pencil eraser. E for evolution:  The spot keeps changing in size, shape, and color.  Please check your skin once per month between visits. You can use a small mirror in front and a large mirror behind you to keep an eye on the back side or your body.   If you see any new or changing lesions before your next follow-up, please call to schedule a visit.  Please continue daily skin protection including broad spectrum sunscreen SPF 30+ to sun-exposed areas, reapplying every 2 hours as needed when you're outdoors.   Staying in the shade or wearing long sleeves, sun glasses (UVA+UVB protection) and wide brim hats (4-inch brim around the entire circumference of the hat) are also recommended for sun protection.    Due to recent changes in healthcare laws, you may see results of your pathology and/or laboratory studies on MyChart before the doctors have had a chance to review them. We understand that in some cases there may be results that are confusing or concerning to you. Please understand that not all results are received at the same time and often the doctors may need to interpret multiple results in order to provide you with the best plan of care  or course of treatment. Therefore, we ask that you please give [place] 2 business days to thoroughly review all your results before contacting the office for clarification. Should we see a critical lab result, you will be contacted sooner.   If You Need Anything After Your Visit  If you have any questions or concerns for your doctor, please call our main line at [phone] and press option 4 to reach your doctor's medical assistant. If no one answers, please leave a voicemail as  directed and we will return your call as soon as possible. Messages left after 4 pm will be answered the following business day.   You may also send [place] a message via [place]. We typically respond to MyChart messages within 1-2 business days.  For prescription refills, please ask your pharmacy to contact our office. Our fax number is [phone].  If you have an urgent issue when the clinic is closed that cannot wait until the next business day, you can page your doctor at the number below.    Please note that while we do our best to be available for urgent issues outside of office hours, we are not available 24/7.   If you have an urgent issue and are unable to reach [place], you may choose to seek medical care at your doctor's office, retail clinic, urgent care center, or emergency room.  If you have a medical emergency, please immediately call 911 or go to the emergency department.  Pager Numbers  - Dr. Nehemiah Massed: [phone]  - Dr. Laurence Ferrari: [phone]  - Dr. Nicole Kindred: [phone]  In the event of inclement weather, please call our main line at [phone] for an update on the status of any delays or closures.  Dermatology Medication Tips: Please keep the boxes that topical medications come in in order to help keep track of the instructions about where and how to use these. Pharmacies typically print the medication instructions only on the boxes and not directly on the medication tubes.   If your medication is too expensive, please contact our office at [phone] option 4 or send [place] a message through [place].   We are unable to tell what your co-pay for medications will be in advance as this is different depending on your insurance coverage. However, we may be able to find a substitute medication at lower cost or fill out paperwork to get insurance to cover a needed medication.   If a prior authorization is required to get your medication covered by your insurance company, please  allow [place] 1-2 business days to complete this process.  Drug prices often vary depending on where the prescription is filled and some pharmacies may offer cheaper prices.  The website www.goodrx.com contains coupons for medications through different pharmacies. The prices here do not account for what the cost may be with help from insurance (it may be cheaper with your insurance), but the website can give you the price if you did not use any insurance.  - You can print the associated coupon and take it with your prescription to the pharmacy.  - You may also stop by our office during regular business hours and pick up a GoodRx coupon card.  - If you need your prescription sent electronically to a different pharmacy, notify our office through [facility] or by phone at [phone] option 4.     Si Usted Necesita Algo Despus de Su Visita  Tambin puede enviarnos un mensaje a travs de Pharmacist, community. Por lo general respondemos  a los mensajes de MyChart en el transcurso de 1 a 2 das hbiles.  Para renovar recetas, por favor pida a su farmacia que se ponga en contacto con nuestra oficina. Harland Dingwall de fax es [place] [phone].  Si tiene un asunto urgente cuando la clnica est cerrada y que no puede esperar hasta el siguiente da hbil, puede llamar/localizar a su doctor(a) al nmero que aparece a continuacin.   Por favor, tenga en cuenta que aunque hacemos todo lo posible para estar disponibles para asuntos urgentes fuera del horario de [place], no estamos disponibles las 24 horas del da, los 7 das de la [place].   Si tiene un problema urgente y no puede comunicarse con nosotros, puede optar por buscar atencin mdica  en el consultorio de su doctor(a), en una clnica privada, en un centro de atencin urgente o en una sala de emergencias.  Si tiene Engineering geologist, por favor llame inmediatamente al 911 o vaya a la sala de emergencias.  Nmeros de bper  - Dr. Nehemiah Massed:  [phone]  - Dra. Moye: [phone]  - Dra. Nicole Kindred: [phone]  En caso de inclemencias del [place], por favor llame a Johnsie Kindred principal al [phone] para una actualizacin sobre el [place] de cualquier retraso o cierre.  Consejos para la medicacin en dermatologa: Por favor, guarde las cajas en las que vienen los medicamentos de uso tpico para ayudarle a seguir las instrucciones sobre dnde y cmo usarlos. Las farmacias generalmente imprimen las instrucciones del medicamento slo en las cajas y no directamente en los tubos del [place].   Si su medicamento es muy caro, por favor, pngase en contacto con Zigmund Daniel llamando al [phone] y presione la opcin 4 o envenos un mensaje a travs de Pharmacist, community.   No podemos decirle cul ser su copago por los medicamentos por adelantado ya que esto es diferente dependiendo de la cobertura de su seguro. Sin embargo, es posible que podamos encontrar un medicamento sustituto a Electrical engineer un formulario para que el seguro cubra el medicamento que se considera necesario.   Si se requiere una autorizacin previa para que su compaa de seguros [place] su medicamento, por favor permtanos de 1 a 2 das hbiles para completar este proceso.  Los precios de los medicamentos varan con frecuencia dependiendo del Environmental consultant de dnde se surte la receta y alguna farmacias pueden ofrecer precios ms baratos.  El sitio web www.goodrx.com tiene cupones para medicamentos de Airline pilot. Los precios aqu no tienen en cuenta lo que podra costar con la ayuda del seguro (puede ser ms barato con su seguro), pero el sitio web puede darle el precio si no utiliz Research scientist (physical sciences).  - Puede imprimir el cupn correspondiente y llevarlo con su receta a la farmacia.  - Tambin puede pasar por nuestra oficina durante el horario de atencin regular y Charity fundraiser una tarjeta de cupones de GoodRx.  - Si necesita que su receta se enve electrnicamente a  una farmacia diferente, informe a nuestra oficina a travs de MyChart de [place] o por telfono llamando al [phone] y presione la opcin 4.

## 2022-05-06 ENCOUNTER — Other Ambulatory Visit: Payer: Self-pay | Admitting: Internal Medicine

## 2022-05-06 DIAGNOSIS — R5381 Other malaise: Secondary | ICD-10-CM

## 2022-05-06 DIAGNOSIS — R1903 Right lower quadrant abdominal swelling, mass and lump: Secondary | ICD-10-CM

## 2022-05-11 ENCOUNTER — Ambulatory Visit
Admission: RE | Admit: 2022-05-11 | Discharge: 2022-05-11 | Disposition: A | Discharge status: Home / Self Care | Payer: Medicare Other | Source: Ambulatory Visit | Attending: Internal Medicine | Admitting: Internal Medicine

## 2022-05-11 DIAGNOSIS — R5383 Other fatigue: Secondary | ICD-10-CM | POA: Diagnosis present

## 2022-05-11 DIAGNOSIS — R1903 Right lower quadrant abdominal swelling, mass and lump: Secondary | ICD-10-CM | POA: Diagnosis present

## 2022-05-11 DIAGNOSIS — R5381 Other malaise: Secondary | ICD-10-CM | POA: Diagnosis present

## 2022-05-11 LAB — POCT I-STAT CREATININE: Creatinine, Ser: 1.4 mg/dL — ABNORMAL HIGH (ref 0.61–1.24)

## 2022-05-11 MED ORDER — IOHEXOL 300 MG/ML  SOLN
100.0000 mL | Freq: Once | INTRAMUSCULAR | Status: AC | PRN
  Administered 2022-05-11: 100 mL via INTRAVENOUS

## 2023-01-12 ENCOUNTER — Encounter: Payer: Self-pay | Admitting: Dermatology

## 2023-01-12 ENCOUNTER — Ambulatory Visit: Payer: Medicare Other | Admitting: Dermatology

## 2023-01-12 VITALS — BP 118/69 | HR 60

## 2023-01-12 DIAGNOSIS — D492 Neoplasm of unspecified behavior of bone, soft tissue, and skin: Secondary | ICD-10-CM

## 2023-01-12 DIAGNOSIS — L989 Disorder of the skin and subcutaneous tissue, unspecified: Secondary | ICD-10-CM | POA: Diagnosis not present

## 2023-01-12 DIAGNOSIS — D485 Neoplasm of uncertain behavior of skin: Secondary | ICD-10-CM | POA: Diagnosis not present

## 2023-01-12 DIAGNOSIS — M67442 Ganglion, left hand: Secondary | ICD-10-CM

## 2023-01-12 DIAGNOSIS — M67449 Ganglion, unspecified hand: Secondary | ICD-10-CM

## 2023-01-12 NOTE — Progress Notes (Signed)
   Follow-Up Visit   Subjective  Keith Gordon is a 82 y.o. male who presents for the following: pain on R posterior ear, tried voltaren cream 1m, painful, glasses irritated, painful bumps fingers   The following portions of the chart were reviewed this encounter and updated as appropriate: medications, allergies, medical history  Review of Systems:  No other skin or systemic complaints except as noted in HPI or Assessment and Plan.  Objective  Well appearing patient in no apparent distress; mood and affect are within normal limits.   A focused examination was performed of the following areas: Right post ear   Assessment & Plan   Neoplasm of skin R post ear crease  Skin / nail biopsy Type of biopsy: tangential   Informed consent: discussed and consent obtained   Anesthesia: the lesion was anesthetized in a standard fashion   Anesthesia comment:  Area prepped with alcohol Anesthetic:  1% lidocaine w/ epinephrine 1-100,000 buffered w/ 8.4% NaHCO3 Instrument used: flexible razor blade   Hemostasis achieved with: pressure, aluminum chloride and electrodesiccation   Outcome: patient tolerated procedure well   Post-procedure details: wound care instructions given   Post-procedure details comment:  Ointment and small bandage applied  Specimen 1 - Surgical pathology Differential Diagnosis: D48.5 R/O BCC  Check Margins: No 7.10mm eroded pink macule  Painful  Painful skin lesion        Digital Mucous Cyst vs Neuropathy causing pain/weakness vrs other Exam: tiny indistinct firm SQ papules L palmar thumb tip, pt states they come and go on different fingertips.  Has difficulty opening jars when they are present  A digital mucous cyst also known as a myxoid cyst or pseudocyst is a ganglion cyst arising from the distal interphalangeal (DIP) joint of the finger or thumb (or, less commonly, toe). The cysts are believed to form from degeneration of connective tissue and are  associated with osteoarthritic joints or injury. Although the exact etiology is unknown, it is likely that a small tear forms in a joint capsule or tendon sheath, allowing extravasation of synovial fluid into the adjacent tissue. When the fluid reacts with local tissue, it becomes more gelatinous and a cyst wall forms. With any treatment, there is a high rate of recurrence.    May try OTC Voltaren gel qd/bid prn pain Recommend discussing with PCP to check for Neuropathy  Return for as scheduled for TBSE.  I, Othelia Pulling, RMA, am acting as scribe for Brendolyn Patty, MD .   Documentation: I have reviewed the above documentation for accuracy and completeness, and I agree with the above.  Brendolyn Patty, MD

## 2023-01-12 NOTE — Patient Instructions (Addendum)
Wound Care Instructions  Cleanse wound gently with soap and water once a day then pat dry with clean gauze. Apply a thin coat of Petrolatum (petroleum jelly, "Vaseline") over the wound (unless you have an allergy to this). We recommend that you use a new, sterile tube of Vaseline. Do not pick or remove scabs. Do not remove the yellow or white "healing tissue" from the base of the wound.  Cover the wound with fresh, clean, nonstick gauze and secure with paper tape. You may use Band-Aids in place of gauze and tape if the wound is small enough, but would recommend trimming much of the tape off as there is often too much. Sometimes Band-Aids can irritate the skin.  You should call the office for your biopsy report after 1 week if you have not already been contacted.  If you experience any problems, such as abnormal amounts of bleeding, swelling, significant bruising, significant pain, or evidence of infection, please call the office immediately.  FOR ADULT SURGERY PATIENTS: If you need something for pain relief you may take 1 extra strength Tylenol (acetaminophen) AND 2 Ibuprofen (200mg each) together every 4 hours as needed for pain. (do not take these if you are allergic to them or if you have a reason you should not take them.) Typically, you may only need pain medication for 1 to 3 days.     Due to recent changes in healthcare laws, you may see results of your pathology and/or laboratory studies on MyChart before the doctors have had a chance to review them. We understand that in some cases there may be results that are confusing or concerning to you. Please understand that not all results are received at the same time and often the doctors may need to interpret multiple results in order to provide you with the best plan of care or course of treatment. Therefore, we ask that you please give us 2 business days to thoroughly review all your results before contacting the office for clarification. Should  we see a critical lab result, you will be contacted sooner.   If You Need Anything After Your Visit  If you have any questions or concerns for your doctor, please call our main line at [phone] and press option 4 to reach your doctor's medical assistant. If no one answers, please leave a voicemail as directed and we will return your call as soon as possible. Messages left after 4 pm will be answered the following business day.   You may also send us a message via MyChart. We typically respond to MyChart messages within 1-2 business days.  For prescription refills, please ask your pharmacy to contact our office. Our fax number is [phone].  If you have an urgent issue when the clinic is closed that cannot wait until the next business day, you can page your doctor at the number below.    Please note that while we do our best to be available for urgent issues outside of office hours, we are not available 24/7.   If you have an urgent issue and are unable to reach us, you may choose to seek medical care at your doctor's office, retail clinic, urgent care center, or emergency room.  If you have a medical emergency, please immediately call 911 or go to the emergency department.  Pager Numbers  - Dr. Kowalski: [phone]  - Dr. Moye: [phone]  - Dr. Stewart: [phone]  In the event of inclement weather, please call our main line at   [phone] for an update on the status of any delays or closures.  Dermatology Medication Tips: Please keep the boxes that topical medications come in in order to help keep track of the instructions about where and how to use these. Pharmacies typically print the medication instructions only on the boxes and not directly on the medication tubes.   If your medication is too expensive, please contact our office at [phone] option 4 or send us a message through MyChart.   We are unable to tell what your co-pay for medications will be in  advance as this is different depending on your insurance coverage. However, we may be able to find a substitute medication at lower cost or fill out paperwork to get insurance to cover a needed medication.   If a prior authorization is required to get your medication covered by your insurance company, please allow us 1-2 business days to complete this process.  Drug prices often vary depending on where the prescription is filled and some pharmacies may offer cheaper prices.  The website www.goodrx.com contains coupons for medications through different pharmacies. The prices here do not account for what the cost may be with help from insurance (it may be cheaper with your insurance), but the website can give you the price if you did not use any insurance.  - You can print the associated coupon and take it with your prescription to the pharmacy.  - You may also stop by our office during regular business hours and pick up a GoodRx coupon card.  - If you need your prescription sent electronically to a different pharmacy, notify our office through [facility] MyChart or by phone at [phone] option 4.     Si Usted Necesita Algo Despus de Su Visita  Tambin puede enviarnos un mensaje a travs de MyChart. Por lo general respondemos a los mensajes de MyChart en el transcurso de 1 a 2 das hbiles.  Para renovar recetas, por favor pida a su farmacia que se ponga en contacto con nuestra oficina. Nuestro nmero de fax es el [phone].  Si tiene un asunto urgente cuando la clnica est cerrada y que no puede esperar hasta el siguiente da hbil, puede llamar/localizar a su doctor(a) al nmero que aparece a continuacin.   Por favor, tenga en cuenta que aunque hacemos todo lo posible para estar disponibles para asuntos urgentes fuera del horario de oficina, no estamos disponibles las 24 horas del da, los 7 das de la semana.   Si tiene un problema urgente y no puede comunicarse con nosotros, puede  optar por buscar atencin mdica  en el consultorio de su doctor(a), en una clnica privada, en un centro de atencin urgente o en una sala de emergencias.  Si tiene una emergencia mdica, por favor llame inmediatamente al 911 o vaya a la sala de emergencias.  Nmeros de bper  - Dr. Kowalski: [phone]  - Dra. Moye: [phone]  - Dra. Stewart: [phone]  En caso de inclemencias del tiempo, por favor llame a nuestra lnea principal al [phone] para una actualizacin sobre el estado de cualquier retraso o cierre.  Consejos para la medicacin en dermatologa: Por favor, guarde las cajas en las que vienen los medicamentos de uso tpico para ayudarle a seguir las instrucciones sobre dnde y cmo usarlos. Las farmacias generalmente imprimen las instrucciones del medicamento slo en las cajas y no directamente en los tubos del medicamento.   Si su medicamento es muy caro, por favor, pngase en contacto con   nuestra oficina llamando al [phone] y presione la opcin 4 o envenos un mensaje a travs de MyChart.   No podemos decirle cul ser su copago por los medicamentos por adelantado ya que esto es diferente dependiendo de la cobertura de su seguro. Sin embargo, es posible que podamos encontrar un medicamento sustituto a menor costo o llenar un formulario para que el seguro cubra el medicamento que se considera necesario.   Si se requiere una autorizacin previa para que su compaa de seguros cubra su medicamento, por favor permtanos de 1 a 2 das hbiles para completar este proceso.  Los precios de los medicamentos varan con frecuencia dependiendo del lugar de dnde se surte la receta y alguna farmacias pueden ofrecer precios ms baratos.  El sitio web www.goodrx.com tiene cupones para medicamentos de diferentes farmacias. Los precios aqu no tienen en cuenta lo que podra costar con la ayuda del seguro (puede ser ms barato con su seguro), pero el sitio web puede darle el  precio si no utiliz ningn seguro.  - Puede imprimir el cupn correspondiente y llevarlo con su receta a la farmacia.  - Tambin puede pasar por nuestra oficina durante el horario de atencin regular y recoger una tarjeta de cupones de GoodRx.  - Si necesita que su receta se enve electrnicamente a una farmacia diferente, informe a nuestra oficina a travs de MyChart de [place] o por telfono llamando al [phone] y presione la opcin 4.  

## 2023-01-18 ENCOUNTER — Telehealth: Payer: Self-pay

## 2023-01-18 NOTE — Telephone Encounter (Signed)
Biopsy results discussed with patient    

## 2023-01-18 NOTE — Telephone Encounter (Signed)
-----   Message from Brendolyn Patty, MD sent at 01/17/2023  6:05 PM EDT ----- Skin , right post ear crease BENIGN EPIDERMAL HYPERPLASIA, ULCERATED, SEE DESCRIPTION   No skin cancer present, continue wound care until healed- please call patient

## 2023-01-18 NOTE — Telephone Encounter (Signed)
Lft pt msg to call for bx results/sh °

## 2023-03-23 ENCOUNTER — Other Ambulatory Visit: Payer: Self-pay | Admitting: Internal Medicine

## 2023-03-23 ENCOUNTER — Ambulatory Visit
Admission: RE | Admit: 2023-03-23 | Discharge: 2023-03-23 | Disposition: A | Discharge status: Home / Self Care | Payer: Medicare Other | Source: Ambulatory Visit | Attending: Internal Medicine | Admitting: Internal Medicine

## 2023-03-23 DIAGNOSIS — R600 Localized edema: Secondary | ICD-10-CM | POA: Insufficient documentation

## 2023-03-23 DIAGNOSIS — R233 Spontaneous ecchymoses: Secondary | ICD-10-CM | POA: Diagnosis present

## 2023-03-29 ENCOUNTER — Encounter: Payer: Self-pay | Admitting: Podiatry

## 2023-03-29 ENCOUNTER — Ambulatory Visit: Payer: Medicare Other | Admitting: Podiatry

## 2023-03-29 DIAGNOSIS — M62461 Contracture of muscle, right lower leg: Secondary | ICD-10-CM

## 2023-03-29 DIAGNOSIS — M722 Plantar fascial fibromatosis: Secondary | ICD-10-CM | POA: Diagnosis not present

## 2023-03-29 NOTE — Progress Notes (Signed)
  Subjective:  Patient ID: Keith Gordon, male    DOB: [birth year],  MRN: [id number]  Chief Complaint  Patient presents with   Foot Pain    Right heel pain     82 y.o. male presents with the above complaint.  Patient presents with right heel pain that has been going for quite some time is progressive gotten worse worse with ambulation hurts with pressure he has not seen anyone as prior to seeing me denies any other acute complaints.  Patient is a pain scale 7 out of 10 dull aching nature   Review of Systems: Negative except as noted in the HPI. Denies N/V/F/Ch.  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   Arthritis    Asthma    as a child   Basal cell carcinoma 04/21/2007   Left helix. Excised 05/31/2007, margins free.   Bleeding ulcer 1982   Dysrhythmia    Family history of adverse reaction to anesthesia    son severe nausea and vomiting   Pneumonia 1960's   history of   PONV (postoperative nausea and vomiting)     Current Outpatient Medications:    pimecrolimus (ELIDEL) 1 % cream, Apply topically as directed. Qd to bid aa itchy rash groin, L popliteal prn flares, Disp: 60 g, Rfl: 2  Social History   Tobacco Use  Smoking Status Former   Types: Cigarettes   Quit date: 02/07/1965   Years since quitting: 58.1  Smokeless Tobacco Never    Allergies  Allergen Reactions   Demerol [Meperidine] Nausea And Vomiting   Dust Mite Extract Other (See Comments)   Levofloxacin Other (See Comments)    Spots on legs    Sulfa Antibiotics Rash   Objective:  There were no vitals filed for this visit. There is no height or weight on file to calculate BMI. Constitutional Well developed. Well nourished.  Vascular Dorsalis pedis pulses palpable bilaterally. Posterior tibial pulses palpable bilaterally. Capillary refill normal to all digits.  No cyanosis or clubbing noted. Pedal hair growth normal.   Neurologic Normal speech. Oriented to person, place, and time. Epicritic sensation to light touch grossly present bilaterally.  Dermatologic Nails well groomed and normal in appearance. No open wounds. No skin lesions.  Orthopedic: Normal joint ROM without pain or crepitus bilaterally. No visible deformities. Tender to palpation at the calcaneal tuber right. No pain with calcaneal squeeze right. Ankle ROM diminished range of motion right. Silfverskiold Test: positive right.   Radiographs: None  Assessment:   1. Plantar fasciitis, right   2. Gastrocnemius equinus, right    Plan:  Patient was evaluated and treated and all questions answered.  Plantar Fasciitis, right with underlying gastrocnemius equinus - XR reviewed as above.  - Educated on icing and stretching. Instructions given.  - Injection delivered to the plantar fascia as below. - DME: Plantar fascial brace dispensed to support the medial longitudinal arch of the foot and offload pressure from the heel and prevent arch collapse during weightbearing - Pharmacologic management: None  Procedure: Injection Tendon/Ligament Location: Right plantar fascia at the glabrous junction; medial approach. Skin Prep: alcohol Injectate: 0.5 cc 0.5% marcaine plain, 0.5 cc of 1% Lidocaine, 0.5 cc kenalog 10. Disposition: Patient tolerated procedure well. Injection site dressed with a band-aid.  No follow-ups on file.

## 2023-04-13 ENCOUNTER — Encounter: Payer: Self-pay | Admitting: Podiatry

## 2023-04-13 ENCOUNTER — Ambulatory Visit: Payer: Medicare Other | Admitting: Podiatry

## 2023-04-13 DIAGNOSIS — M7062 Trochanteric bursitis, left hip: Secondary | ICD-10-CM | POA: Insufficient documentation

## 2023-04-13 DIAGNOSIS — M722 Plantar fascial fibromatosis: Secondary | ICD-10-CM

## 2023-04-13 MED ORDER — METHYLPREDNISOLONE 4 MG PO TBPK: ORAL_TABLET | ORAL | 0 refills | Status: DC

## 2023-04-13 MED ORDER — TRIAMCINOLONE ACETONIDE 40 MG/ML IJ SUSP
20.0000 mg | Freq: Once | INTRAMUSCULAR | Status: AC
Start: 2023-04-13 — End: 2023-04-13
  Administered 2023-04-13: 20 mg

## 2023-04-13 MED ORDER — MELOXICAM 15 MG PO TABS: 15.0000 mg | ORAL_TABLET | Freq: Every day | ORAL | 3 refills | Status: AC

## 2023-04-13 NOTE — Progress Notes (Signed)
Keith Gordon presents today for follow-up of his Planter fasciitis of his right foot.  States that the last time he was then Dr. Allena Katz had provided him with an injection and a plantar fascial brace.  He states that it was probably about 2 days after the injection that it started hurting again.  Objective: Vital signs are stable he is alert and oriented x 3 pulses are palpable.  She has severe pain on palpation medial calcaneal tubercle of the right heel as well as in the distalmost aspect of the Achilles fibers posterior and inferiorly.  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  Plan: Discussed etiology pathology conservative versus surgical therapies at this point I recommended another injection and started him on methylprednisolone to be followed by meloxicam discussed appropriate shoe gear with him and placed him in a plantar fascial brace.  He will continue the use of this brace and this medication and will follow-up with Dr. Allena Katz in mid July.  Should this not alleviate his symptoms significantly I think an MRI would be necessary to rule out a tear of the plantar fascia.

## 2023-04-19 ENCOUNTER — Encounter: Payer: Medicare Other | Admitting: Dermatology

## 2023-04-26 ENCOUNTER — Ambulatory Visit: Payer: Medicare Other | Admitting: Podiatry

## 2023-05-11 ENCOUNTER — Encounter: Payer: Self-pay | Admitting: Dermatology

## 2023-05-11 ENCOUNTER — Ambulatory Visit: Payer: Medicare Other | Admitting: Dermatology

## 2023-05-11 VITALS — BP 134/74 | HR 59

## 2023-05-11 DIAGNOSIS — W908XXA Exposure to other nonionizing radiation, initial encounter: Secondary | ICD-10-CM

## 2023-05-11 DIAGNOSIS — Z872 Personal history of diseases of the skin and subcutaneous tissue: Secondary | ICD-10-CM

## 2023-05-11 DIAGNOSIS — M67449 Ganglion, unspecified hand: Secondary | ICD-10-CM

## 2023-05-11 DIAGNOSIS — D225 Melanocytic nevi of trunk: Secondary | ICD-10-CM

## 2023-05-11 DIAGNOSIS — L821 Other seborrheic keratosis: Secondary | ICD-10-CM

## 2023-05-11 DIAGNOSIS — L570 Actinic keratosis: Secondary | ICD-10-CM | POA: Diagnosis not present

## 2023-05-11 DIAGNOSIS — L814 Other melanin hyperpigmentation: Secondary | ICD-10-CM

## 2023-05-11 DIAGNOSIS — Z1283 Encounter for screening for malignant neoplasm of skin: Secondary | ICD-10-CM | POA: Diagnosis not present

## 2023-05-11 DIAGNOSIS — L910 Hypertrophic scar: Secondary | ICD-10-CM

## 2023-05-11 DIAGNOSIS — D3617 Benign neoplasm of peripheral nerves and autonomic nervous system of trunk, unspecified: Secondary | ICD-10-CM

## 2023-05-11 DIAGNOSIS — L578 Other skin changes due to chronic exposure to nonionizing radiation: Secondary | ICD-10-CM

## 2023-05-11 DIAGNOSIS — M67441 Ganglion, right hand: Secondary | ICD-10-CM

## 2023-05-11 DIAGNOSIS — D2239 Melanocytic nevi of other parts of face: Secondary | ICD-10-CM

## 2023-05-11 DIAGNOSIS — L738 Other specified follicular disorders: Secondary | ICD-10-CM

## 2023-05-11 DIAGNOSIS — D3612 Benign neoplasm of peripheral nerves and autonomic nervous system, upper limb, including shoulder: Secondary | ICD-10-CM

## 2023-05-11 DIAGNOSIS — Z85828 Personal history of other malignant neoplasm of skin: Secondary | ICD-10-CM

## 2023-05-11 DIAGNOSIS — D229 Melanocytic nevi, unspecified: Secondary | ICD-10-CM

## 2023-05-11 DIAGNOSIS — D3610 Benign neoplasm of peripheral nerves and autonomic nervous system, unspecified: Secondary | ICD-10-CM

## 2023-05-11 NOTE — Patient Instructions (Addendum)
If spot doesn't heal or go away at left forearm call or send mychart in several weeks   Actinic keratoses are precancerous spots that appear secondary to cumulative UV radiation exposure/sun exposure over time. They are chronic with expected duration over 1 year. A portion of actinic keratoses will progress to squamous cell carcinoma of the skin. It is not possible to reliably predict which spots will progress to skin cancer and so treatment is recommended to prevent development of skin cancer.  Recommend daily broad spectrum sunscreen SPF 30+ to sun-exposed areas, reapply every 2 hours as needed.  Recommend staying in the shade or wearing long sleeves, sun glasses (UVA+UVB protection) and wide brim hats (4-inch brim around the entire circumference of the hat). Call for new or changing lesions.   Cryotherapy Aftercare  Wash gently with soap and water everyday.   Apply Vaseline and Band-Aid daily until healed.       Melanoma ABCDEs  Melanoma is the most dangerous type of skin cancer, and is the leading cause of death from skin disease.  You are more likely to develop melanoma if you: Have light-colored skin, light-colored eyes, or red or blond hair Spend a lot of time in the sun Tan regularly, either outdoors or in a tanning bed Have had blistering sunburns, especially during childhood Have a close family member who has had a melanoma Have atypical moles or large birthmarks  Early detection of melanoma is key since treatment is typically straightforward and cure rates are extremely high if we catch it early.   The first sign of melanoma is often a change in a mole or a new dark spot.  The ABCDE system is a way of remembering the signs of melanoma.  A for asymmetry:  The two halves do not match. B for border:  The edges of the growth are irregular. C for color:  A mixture of colors are present instead of an even brown color. D for diameter:  Melanomas are usually (but not always)  greater than 6mm - the size of a pencil eraser. E for evolution:  The spot keeps changing in size, shape, and color.  Please check your skin once per month between visits. You can use a small mirror in front and a large mirror behind you to keep an eye on the back side or your body.   If you see any new or changing lesions before your next follow-up, please call to schedule a visit.  Please continue daily skin protection including broad spectrum sunscreen SPF 30+ to sun-exposed areas, reapplying every 2 hours as needed when you're outdoors.   Staying in the shade or wearing long sleeves, sun glasses (UVA+UVB protection) and wide brim hats (4-inch brim around the entire circumference of the hat) are also recommended for sun protection.     Due to recent changes in healthcare laws, you may see results of your pathology and/or laboratory studies on MyChart before the doctors have had a chance to review them. We understand that in some cases there may be results that are confusing or concerning to you. Please understand that not all results are received at the same time and often the doctors may need to interpret multiple results in order to provide you with the best plan of care or course of treatment. Therefore, we ask that you please give [place] 2 business days to thoroughly review all your results before contacting the office for clarification. Should we see a critical lab result, you will  be contacted sooner.   If You Need Anything After Your Visit  If you have any questions or concerns for your doctor, please call our main line at [phone] and press option 4 to reach your doctor's medical assistant. If no one answers, please leave a voicemail as directed and we will return your call as soon as possible. Messages left after 4 pm will be answered the following business day.   You may also send [place] a message via MyChart. We typically respond to MyChart messages within 1-2 business days.  For  prescription refills, please ask your pharmacy to contact our office. Our fax number is [phone].  If you have an urgent issue when the clinic is closed that cannot wait until the next business day, you can page your doctor at the number below.    Please note that while we do our best to be available for urgent issues outside of office hours, we are not available 24/7.   If you have an urgent issue and are unable to reach [place], you may choose to seek medical care at your doctor's office, retail clinic, urgent care center, or emergency room.  If you have a medical emergency, please immediately call 911 or go to the emergency department.  Pager Numbers  - Dr. Gwen Pounds: [phone]  - Dr. Neale Burly: [phone]  - Dr. Roseanne Reno: [phone]  In the event of inclement weather, please call our main line at [phone] for an update on the status of any delays or closures.  Dermatology Medication Tips: Please keep the boxes that topical medications come in in order to help keep track of the instructions about where and how to use these. Pharmacies typically print the medication instructions only on the boxes and not directly on the medication tubes.   If your medication is too expensive, please contact our office at [phone] option 4 or send [place] a message through MyChart.   We are unable to tell what your co-pay for medications will be in advance as this is different depending on your insurance coverage. However, we may be able to find a substitute medication at lower cost or fill out paperwork to get insurance to cover a needed medication.   If a prior authorization is required to get your medication covered by your insurance company, please allow [place] 1-2 business days to complete this process.  Drug prices often vary depending on where the prescription is filled and some pharmacies may offer cheaper prices.  The website www.goodrx.com contains coupons for medications through different  pharmacies. The prices here do not account for what the cost may be with help from insurance (it may be cheaper with your insurance), but the website can give you the price if you did not use any insurance.  - You can print the associated coupon and take it with your prescription to the pharmacy.  - You may also stop by our office during regular business hours and pick up a GoodRx coupon card.  - If you need your prescription sent electronically to a different pharmacy, notify our office through [facility] or by phone at [phone] option 4.     Si Usted Necesita Algo Despus de Su Visita  Tambin puede enviarnos un mensaje a travs de Clinical cytogeneticist. Por lo general respondemos a los mensajes de MyChart en el transcurso de 1 a 2 das hbiles.  Para renovar recetas, por favor pida a su farmacia que se ponga en contacto con nuestra oficina. Annie Sable de fax  es el [phone].  Si tiene un asunto urgente cuando la clnica est cerrada y que no puede esperar hasta el siguiente da hbil, puede llamar/localizar a su doctor(a) al nmero que aparece a continuacin.   Por favor, tenga en cuenta que aunque hacemos todo lo posible para estar disponibles para asuntos urgentes fuera del horario de [place], no estamos disponibles las 24 horas del da, los 7 [address]  [address] de la [place].   Si tiene un problema urgente y no puede comunicarse con nosotros, puede optar por buscar atencin mdica  en el consultorio de su doctor(a), en una clnica privada, en un centro de atencin urgente o en una sala de emergencias.  Si tiene Engineer, drilling, por favor llame inmediatamente al 911 o vaya a la sala de emergencias.  Nmeros de bper  - Dr. Gwen Pounds: [phone]  - Dra. Moye: [phone]  - Dra. Roseanne Reno: [phone]  En caso de inclemencias del [place], por favor llame a Lacy Duverney principal al [phone] para una actualizacin sobre el [place] de cualquier retraso o cierre.  Consejos para la  medicacin en dermatologa: Por favor, guarde las cajas en las que vienen los medicamentos de uso tpico para ayudarle a seguir las instrucciones sobre dnde y cmo usarlos. Las farmacias generalmente imprimen las instrucciones del medicamento slo en las cajas y no directamente en los tubos del [place].   Si su medicamento es muy caro, por favor, pngase en contacto con Rolm Gala llamando al [phone] y presione la opcin 4 o envenos un mensaje a travs de Clinical cytogeneticist.   No podemos decirle cul ser su copago por los medicamentos por adelantado ya que esto es diferente dependiendo de la cobertura de su seguro. Sin embargo, es posible que podamos encontrar un medicamento sustituto a Audiological scientist un formulario para que el seguro cubra el medicamento que se considera necesario.   Si se requiere una autorizacin previa para que su compaa de seguros [place] su medicamento, por favor permtanos de 1 a 2 das hbiles para completar [address]  Los precios de los medicamentos varan con frecuencia dependiendo del Environmental consultant de dnde se surte la receta y alguna farmacias pueden ofrecer precios ms baratos.  El sitio web www.goodrx.com tiene cupones para medicamentos de Health and safety inspector. Los precios aqu no tienen en cuenta lo que podra costar con la ayuda del seguro (puede ser ms barato con su seguro), pero el sitio web puede darle el precio si no utiliz Tourist information centre manager.  - Puede imprimir el cupn correspondiente y llevarlo con su receta a la farmacia.  - Tambin puede pasar por nuestra oficina durante el horario de atencin regular y Education officer, museum una tarjeta de cupones de GoodRx.  - Si necesita que su receta se enve electrnicamente a una farmacia diferente, informe a nuestra oficina a travs de MyChart de [place] o por telfono llamando al [phone] y presione la opcin 4.

## 2023-05-11 NOTE — Progress Notes (Signed)
Follow-Up Visit   Subjective  Keith Gordon is a 82 y.o. male who presents for the following: Skin Cancer Screening and Full Body Skin Exam  hx of bcc, hx of aks, hx of isks  Reports a spot at left forearm and right index finger.   The patient presents for Total-Body Skin Exam (TBSE) for skin cancer screening and mole check. The patient has spots, moles and lesions to be evaluated, some may be new or changing and the patient may have concern these could be cancer.    The following portions of the chart were reviewed this encounter and updated as appropriate: medications, allergies, medical history  Review of Systems:  No other skin or systemic complaints except as noted in HPI or Assessment and Plan.  Objective  Well appearing patient in no apparent distress; mood and affect are within normal limits.  A full examination was performed including scalp, head, eyes, ears, nose, lips, neck, chest, axillae, abdomen, back, buttocks, bilateral upper extremities, bilateral lower extremities, hands, feet, fingers, toes, fingernails, and toenails. All findings within normal limits unless otherwise noted below.   Relevant physical exam findings are noted in the Assessment and Plan.  left forearm x 1 Keratotic papule    Assessment & Plan    Actinic keratosis left forearm x 1  Hypertrophic ak vs Isk at left forearm, if doesn't clear pt will RTC, will consider bx   Actinic keratoses are precancerous spots that appear secondary to cumulative UV radiation exposure/sun exposure over time. They are chronic with expected duration over 1 year. A portion of actinic keratoses will progress to squamous cell carcinoma of the skin. It is not possible to reliably predict which spots will progress to skin cancer and so treatment is recommended to prevent development of skin cancer.  Recommend daily broad spectrum sunscreen SPF 30+ to sun-exposed areas, reapply every 2 hours as needed.  Recommend  staying in the shade or wearing long sleeves, sun glasses (UVA+UVB protection) and wide brim hats (4-inch brim around the entire circumference of the hat). Call for new or changing lesions.  Destruction of lesion - left forearm x 1  Destruction method: cryotherapy   Informed consent: discussed and consent obtained   Lesion destroyed using liquid nitrogen: Yes   Region frozen until ice ball extended beyond lesion: Yes   Outcome: patient tolerated procedure well with no complications   Post-procedure details: wound care instructions given   Additional details:  Prior to procedure, discussed risks of blister formation, small wound, skin dyspigmentation, or rare scar following cryotherapy. Recommend Vaseline ointment to treated areas while healing.   Digital mucous cyst  Neurofibroma  Sebaceous hyperplasia  Nevus  Actinic skin damage  Skin cancer screening  Lentigo  Seborrheic keratosis    SKIN CANCER SCREENING PERFORMED TODAY.  ACTINIC DAMAGE - Chronic condition, secondary to cumulative UV/sun exposure - diffuse scaly erythematous macules with underlying dyspigmentation - Recommend daily broad spectrum sunscreen SPF 30+ to sun-exposed areas, reapply every 2 hours as needed.  - Staying in the shade or wearing long sleeves, sun glasses (UVA+UVB protection) and wide brim hats (4-inch brim around the entire circumference of the hat) are also recommended for sun protection.  - Call for new or changing lesions.  LENTIGINES, SEBORRHEIC KERATOSES, HEMANGIOMAS - Benign normal skin lesions - Benign-appearing - Call for any changes  MELANOCYTIC NEVI - Tan-brown and/or pink-flesh-colored symmetric macules and papules - Benign appearing on exam today - Observation - Call clinic for new or  changing moles - Recommend daily use of broad spectrum spf 30+ sunscreen to sun-exposed areas.  Nevus  Left abdomen 4 mm brown papule with lighter center    right interior chin 6 mm pink flesh  papule (Nevus vs neurofibroma)  Benign-appearing.  Observation.  Call clinic for new or changing lesions.  Recommend daily use of broad spectrum spf 30+ sunscreen to sun-exposed areas.    Sebaceous Hyperplasia Face  - Small yellow papules with a central dell - Benign-appearing - Observe. Call for changes.   HISTORY OF BASAL CELL CARCINOMA  left ear helix 04/21/2007  excised 05/31/2007 - No evidence of recurrence today - Recommend regular full body skin exams - Recommend daily broad spectrum sunscreen SPF 30+ to sun-exposed areas, reapply every 2 hours as needed.  - Call if any new or changing lesions are noted between office visits  Hypertrophic scar Exam:  left shoulder- 8 mm firm white pink nodule    Hx of bx-proven neurofibroma, base involved, s/p shave removal.    Benign. Observe.  Neurofibroma Exam: Right Shoulder Anterior and left lower back- Soft flesh papules   Benign, observe  DIGITAL MUCOUS CYST Exam: right index PIP  5 mm firm flesh/ white nodule   A digital mucous cyst also known as a myxoid cyst or pseudocyst is a ganglion cyst arising from the distal interphalangeal (DIP) joint of the finger or thumb (or, less commonly, toe). The cysts are believed to form from degeneration of connective tissue and are associated with osteoarthritic joints or injury. Although the exact etiology is unknown, it is likely that a small tear forms in a joint capsule or tendon sheath, allowing extravasation of synovial fluid into the adjacent tissue. When the fluid reacts with local tissue, it becomes more gelatinous and a cyst wall forms. With any treatment, there is a high rate of recurrence.   Treatment options include: - Puncture / Incision & Drainage (I&D) - Intralesional steroid injection - Intralesional Sclerosant injection (Asclera/ Polidocanol) - Intralesional steroid + sclerosant - Corticosteroid tape - Cryosurgery - Laser (CO2) - Infrared photocoagulation - Excision /  Surgery  Treatment Plan: Benign. Observe If very bothersome would recommend hand surgeon to excise, can send referral if needed   Return in about 1 year (around 05/10/2024) for TBSE, h/o BCC, AK.  I, Asher Muir, CMA, am acting as scribe for Willeen Niece, MD.   Documentation: I have reviewed the above documentation for accuracy and completeness, and I agree with the above.  Willeen Niece, MD

## 2023-06-21 ENCOUNTER — Other Ambulatory Visit: Payer: Self-pay

## 2023-06-21 ENCOUNTER — Telehealth: Payer: Self-pay

## 2023-06-21 NOTE — Progress Notes (Signed)
LM on VM returning his call please call back

## 2023-06-21 NOTE — Telephone Encounter (Signed)
Patient was seen by Dr. Roseanne Reno 05/11/23. A cyst was documented on the right index and she states "Benign. Observe If very bothersome would recommend hand surgeon to excise, can send referral if needed". Patient called and the area is bothersome and would like referral. Okay to send to [facility]?

## 2023-06-21 NOTE — Telephone Encounter (Signed)
Referral submitted through [facility] website. aw

## 2023-08-30 ENCOUNTER — Other Ambulatory Visit: Payer: Self-pay | Admitting: Internal Medicine

## 2023-08-30 DIAGNOSIS — H539 Unspecified visual disturbance: Secondary | ICD-10-CM

## 2023-08-30 DIAGNOSIS — R251 Tremor, unspecified: Secondary | ICD-10-CM

## 2023-08-31 ENCOUNTER — Ambulatory Visit: Payer: Medicare Other | Admitting: Urology

## 2023-08-31 ENCOUNTER — Encounter: Payer: Self-pay | Admitting: Urology

## 2023-08-31 ENCOUNTER — Ambulatory Visit
Admission: RE | Admit: 2023-08-31 | Discharge: 2023-08-31 | Disposition: A | Discharge status: Home / Self Care | Payer: Medicare Other | Source: Ambulatory Visit | Attending: Internal Medicine | Admitting: Internal Medicine

## 2023-08-31 VITALS — BP 119/75 | HR 71 | Ht 72.0 in | Wt 200.0 lb

## 2023-08-31 DIAGNOSIS — H539 Unspecified visual disturbance: Secondary | ICD-10-CM | POA: Diagnosis present

## 2023-08-31 DIAGNOSIS — R251 Tremor, unspecified: Secondary | ICD-10-CM | POA: Insufficient documentation

## 2023-08-31 DIAGNOSIS — R972 Elevated prostate specific antigen [PSA]: Secondary | ICD-10-CM | POA: Diagnosis not present

## 2023-08-31 NOTE — Progress Notes (Signed)
I, Maysun Anabel Bene, acting as a scribe for Riki Altes, MD., have documented all relevant documentation on the behalf of Riki Altes, MD, as directed by Riki Altes, MD while in the presence of Riki Altes, MD.  08/31/2023 1:21 PM   Keith Gordon 08/09/1941 [id number]  Referring provider: Barbette Reichmann, MD [address] [facility] [place],  [place] [address]  Chief Complaint  Patient presents with   Benign Prostatic Hypertrophy    HPI: Keith Gordon is a 82 y.o. male referred for evaluation of an elevated PSA.   He was seen by Dr. Richardo Hanks in 2020 for a history of a prostate nodule that had been followed with stable exams and normal PSAs. He was last seen here in 2022 for an inflamed sebaceous cyst. Baseline PSA has been in the mid 2 range. 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   Arthritis    Asthma    as a child   Basal cell carcinoma 04/21/2007   Left helix. Excised 05/31/2007, margins free.   Bleeding ulcer 1982   Dysrhythmia    Family history of adverse reaction to anesthesia    son severe nausea and vomiting   Pneumonia 1960's   history of   PONV (postoperative nausea and vomiting)     Surgical History: Past Surgical History:  Procedure Laterality Date   APPENDECTOMY  1968   CHOLECYSTECTOMY  2001   COLONOSCOPY WITH PROPOFOL N/A 04/19/2018   Procedure: COLONOSCOPY WITH PROPOFOL;  Surgeon: Toledo, Boykin Nearing, MD;  Location: [facility] ENDOSCOPY;  Service: Endoscopy;  Laterality: N/A;   EYE SURGERY Right 2001   tear duct   OPEN SURGICAL REPAIR OF GLUTEAL TENDON Left 02/15/2017   Procedure: PRIMARY REPAIR OF THE GLUTEUS MEDIUS TENDON OF  HIP;  Surgeon: Christena Flake, MD;  Location: [facility] ORS;  Service: Orthopedics;  Laterality: Left;   ROTATOR CUFF REPAIR Left 1995 & 1997   TONSILLECTOMY  1951    Home Medications:  Allergies as of 08/31/2023       Reactions   Ether Nausea And Vomiting   Any anesthesia except propofol   Demerol [meperidine] Nausea And Vomiting   Dust Mite Extract Other (See Comments)   Levofloxacin Other (See Comments)   Spots on legs    Sulfa Antibiotics Rash        Medication List        Accurate as of August 31, 2023  1:21 PM. If you have any questions, ask your nurse or doctor.          STOP taking these medications    celecoxib 100 MG capsule Commonly known as: CELEBREX Stopped by: Riki Altes   furosemide 20 MG tablet Commonly known as: LASIX Stopped by: Riki Altes       TAKE these medications    albuterol 108 (90 Base) MCG/ACT inhaler Commonly known as: VENTOLIN HFA Inhale into the lungs.   Flovent HFA 220 MCG/ACT inhaler Generic drug: fluticasone Inhale 2 puffs into the lungs 2 (two) times daily.   meloxicam 15 MG tablet Commonly known as: MOBIC Take 1 tablet (15 mg total) by mouth daily.  methylPREDNISolone 4 MG Tbpk tablet Commonly known as: MEDROL DOSEPAK 6 day dose pack - take as directed   pimecrolimus 1 % cream Commonly known as: Elidel Apply topically as directed. Qd to bid aa itchy rash groin, L popliteal prn flares        Allergies:  Allergies  Allergen Reactions   Ether Nausea And Vomiting    Any anesthesia except propofol   Demerol [Meperidine] Nausea And Vomiting   Dust Mite Extract Other (See Comments)   Levofloxacin Other (See Comments)    Spots on legs    Sulfa Antibiotics Rash   Social History:  reports that he quit smoking about 58 years ago. His smoking use included cigarettes. He has never used smokeless tobacco. He reports that he does not drink alcohol and does not use drugs.   Physical Exam: BP 119/75   Pulse 71    Ht 6' (1.829 m)   Wt 200 lb (90.7 kg)   BMI 27.12 kg/m   Constitutional:  Alert, No acute distress. HEENT: [place] AT Respiratory: Normal respiratory effort, no increased work of breathing. GU: Prostate 50 grams, smooth with mobile nodule at apex which has been previously described. Psychiatric: Normal mood and affect.   Assessment & Plan:    1. Elevated PSA Elevated PSA above baseline.  Stable DRE.  We discussed current recommendations for prostate cancer screening do not recommend PSAs past age 52 and healthy patients can be extended to age 15.  We also discussed approximately 50% of men in their 80s will have low-grade prostate cancer for which treatment is not recommended. Although PSA is a prostate cancer screening test he was informed that cancer is not the most common cause of an elevated PSA. Other potential causes including BPH and inflammation were discussed.  He was informed that the only way to adequately diagnose prostate cancer would be transrectal ultrasound and biopsy of the prostate. The procedure was discussed including potential risks of bleeding and infection/sepsis. He was also informed that a negative biopsy does not conclusively rule out the possibility that prostate cancer may be present and that continued monitoring is required.  The use of newer adjunctive blood and urine tests were discussed.  The use of multiparametric prostate MRI to evaluate for lesions suspicious for high grade prostate cancer and aid in targeted bx was reviewed.  Continued periodic surveillance was also discussed. He states he is scheduled for a follow-up PSA in approximately 2 months at his PCP office. If his PSA is rising, he would like to proceed with prostate MRI.   I have reviewed the above documentation for accuracy and completeness, and I agree with the above.   Riki Altes, MD  [facility] [address] [place], [place] [address] [phone]

## 2024-05-22 ENCOUNTER — Ambulatory Visit: Payer: Medicare Other | Admitting: Dermatology

## 2024-05-22 DIAGNOSIS — L578 Other skin changes due to chronic exposure to nonionizing radiation: Secondary | ICD-10-CM | POA: Diagnosis not present

## 2024-05-22 DIAGNOSIS — L814 Other melanin hyperpigmentation: Secondary | ICD-10-CM | POA: Diagnosis not present

## 2024-05-22 DIAGNOSIS — D3610 Benign neoplasm of peripheral nerves and autonomic nervous system, unspecified: Secondary | ICD-10-CM

## 2024-05-22 DIAGNOSIS — L820 Inflamed seborrheic keratosis: Secondary | ICD-10-CM | POA: Diagnosis not present

## 2024-05-22 DIAGNOSIS — L738 Other specified follicular disorders: Secondary | ICD-10-CM

## 2024-05-22 DIAGNOSIS — Z872 Personal history of diseases of the skin and subcutaneous tissue: Secondary | ICD-10-CM

## 2024-05-22 DIAGNOSIS — Z85828 Personal history of other malignant neoplasm of skin: Secondary | ICD-10-CM

## 2024-05-22 DIAGNOSIS — D692 Other nonthrombocytopenic purpura: Secondary | ICD-10-CM

## 2024-05-22 DIAGNOSIS — L821 Other seborrheic keratosis: Secondary | ICD-10-CM

## 2024-05-22 DIAGNOSIS — L910 Hypertrophic scar: Secondary | ICD-10-CM

## 2024-05-22 DIAGNOSIS — Z1283 Encounter for screening for malignant neoplasm of skin: Secondary | ICD-10-CM | POA: Diagnosis not present

## 2024-05-22 DIAGNOSIS — D1801 Hemangioma of skin and subcutaneous tissue: Secondary | ICD-10-CM

## 2024-05-22 DIAGNOSIS — L729 Follicular cyst of the skin and subcutaneous tissue, unspecified: Secondary | ICD-10-CM

## 2024-05-22 DIAGNOSIS — D229 Melanocytic nevi, unspecified: Secondary | ICD-10-CM

## 2024-05-22 NOTE — Patient Instructions (Addendum)

## 2024-05-22 NOTE — Progress Notes (Signed)
 Follow-Up Visit   Subjective  Keith Gordon is a 83 y.o. male who presents for the following: Skin Cancer Screening and Upper Body Skin Exam  The patient presents for Upper Body Skin Exam (UBSE) for skin cancer screening and mole check. The patient has spots, moles and lesions to be evaluated, some may be new or changing. He has a new spot on his left upper back to check, also a spot on his right chest that swelled up and drained, now better but still there. Also crusty spot on upper lip.     The following portions of the chart were reviewed this encounter and updated as appropriate: medications, allergies, medical history  Review of Systems:  No other skin or systemic complaints except as noted in HPI or Assessment and Plan.  Objective  Well appearing patient in no apparent distress; mood and affect are within normal limits.  All skin waist up examined. Relevant physical exam findings are noted in the Assessment and Plan.  L post lower neck x 1, R upper lip x 1 Erythematous stuck-on, waxy papule   Assessment & Plan   INFLAMED SEBORRHEIC KERATOSIS L post lower neck x 1, R upper lip x 1 Symptomatic, irritating, patient would like treated. Destruction of lesion - L post lower neck x 1, R upper lip x 1  Destruction method: cryotherapy   Informed consent: discussed and consent obtained   Lesion destroyed using liquid nitrogen: Yes   Region frozen until ice ball extended beyond lesion: Yes   Outcome: patient tolerated procedure well with no complications   Post-procedure details: wound care instructions given   Additional details:  Prior to procedure, discussed risks of blister formation, small wound, skin dyspigmentation, or rare scar following cryotherapy. Recommend Vaseline ointment to treated areas while healing.   Skin cancer screening performed today.  Actinic Damage - Chronic condition, secondary to cumulative UV/sun exposure - diffuse scaly erythematous macules with  underlying dyspigmentation - Recommend daily broad spectrum sunscreen SPF 30+ to sun-exposed areas, reapply every 2 hours as needed.  - Staying in the shade or wearing long sleeves, sun glasses (UVA+UVB protection) and wide brim hats (4-inch brim around the entire circumference of the hat) are also recommended for sun protection.  - Call for new or changing lesions.  Lentigines, Seborrheic Keratoses, Hemangiomas - Benign normal skin lesions - Benign-appearing - Call for any changes  Melanocytic Nevi - Tan-brown and/or pink-flesh-colored symmetric macules and papules - Left abdomen 4 mm thin brown papule with lighter center   - right inferior chin 6 mm pink flesh papule (Nevus vs neurofibroma) - stable compared to previous visit, no change per pt, no bleeding. - Benign appearing on exam today - Observation - Call clinic for new or changing moles - Recommend daily use of broad spectrum spf 30+ sunscreen to sun-exposed areas.   HISTORY OF BASAL CELL CARCINOMA  left ear helix excised 05/31/2007 - No evidence of recurrence today - Recommend regular full body skin exams - Recommend daily broad spectrum sunscreen SPF 30+ to sun-exposed areas, reapply every 2 hours as needed.  - Call if any new or changing lesions are noted between office visits  RESOLVING INFLAMED CYST Exam: Right ant shoulder with 7 mm pink firm flat dermal papule  Benign-appearing. Exam most consistent with an epidermal inclusion cyst. Discussed that a cyst is a benign growth that can grow over time and sometimes get irritated or inflamed. Recommend observation if it is not bothersome. Discussed option of  surgical excision to remove it if it is growing, symptomatic, or other changes noted. Please call for new or changing lesions so they can be evaluated.  Hypertrophic scar Exam: left shoulder- 8 mm firm white pink nodule    Hx of bx-proven neurofibroma, base involved, s/p shave removal.   Benign. Observe.    Neurofibroma Exam: Right Shoulder Anterior and left lower back- Soft flesh papules   Benign, observe  Sebaceous Hyperplasia - Small yellow papules with a central dell face - Benign-appearing - Observe. Call for changes.  Purpura - Chronic; persistent and recurrent.  Treatable, but not curable. - Violaceous macules and patches - Benign - Related to trauma, age, sun damage and/or use of blood thinners, chronic use of topical and/or oral steroids - Observe - Can use OTC arnica containing moisturizer such as Dermend Bruise Formula if desired - Call for worsening or other concerns  HISTORY OF PRECANCEROUS ACTINIC KERATOSIS - site(s) of PreCancerous Actinic Keratosis clear today. - these may recur and new lesions may form requiring treatment to prevent transformation into skin cancer - observe for new or changing spots and contact [facility] for appointment if occur - photoprotection with sun protective clothing; sunglasses and broad spectrum sunscreen with SPF of at least 30 + and frequent self skin exams recommended - yearly exams by a dermatologist recommended for persons with history of PreCancerous Actinic Keratoses  Return in about 1 year (around 05/22/2025) for UBSE, Hx BCC.  IAndrea Kerns, CMA, am acting as scribe for Rexene Rattler, MD .   Documentation: I have reviewed the above documentation for accuracy and completeness, and I agree with the above.  Rexene Rattler, MD

## 2024-07-16 ENCOUNTER — Encounter: Payer: Self-pay | Admitting: Urology

## 2024-07-16 ENCOUNTER — Telehealth: Payer: Self-pay

## 2024-07-16 ENCOUNTER — Other Ambulatory Visit: Payer: Self-pay

## 2024-07-16 ENCOUNTER — Ambulatory Visit: Admitting: Urology

## 2024-07-16 ENCOUNTER — Other Ambulatory Visit
Admission: RE | Admit: 2024-07-16 | Discharge: 2024-07-16 | Disposition: A | Discharge status: Home / Self Care | Attending: Urology | Admitting: Urology

## 2024-07-16 VITALS — BP 127/78 | HR 88 | Wt 199.0 lb

## 2024-07-16 DIAGNOSIS — R300 Dysuria: Secondary | ICD-10-CM

## 2024-07-16 DIAGNOSIS — N201 Calculus of ureter: Secondary | ICD-10-CM

## 2024-07-16 DIAGNOSIS — N390 Urinary tract infection, site not specified: Secondary | ICD-10-CM

## 2024-07-16 DIAGNOSIS — R972 Elevated prostate specific antigen [PSA]: Secondary | ICD-10-CM | POA: Diagnosis not present

## 2024-07-16 LAB — URINALYSIS, COMPLETE (UACMP) WITH MICROSCOPIC
Bilirubin Urine: NEGATIVE
Glucose, UA: NEGATIVE mg/dL
Ketones, ur: NEGATIVE mg/dL
Nitrite: POSITIVE — AB
Protein, ur: NEGATIVE mg/dL
Specific Gravity, Urine: 1.015 (ref 1.005–1.030)
WBC, UA: >50 WBC/hpf (ref 0–5)
pH: 6 (ref 5.0–8.0)

## 2024-07-16 MED ORDER — AMOXICILLIN 875 MG PO TABS: 875.0000 mg | ORAL_TABLET | Freq: Two times a day (BID) | ORAL | 0 refills | Status: DC

## 2024-07-16 NOTE — Progress Notes (Signed)
 07/16/2024 1:58 PM   Keith Gordon 09/30/1941 [id number]  Referring provider: Sadie Manna, MD [address] [facility] [place],  [place] [address]  Urological history: 1.  Elevated PSA - PSA (02/2023) 5.51  2. BPH with LU TS  3. Prostate nodule - present since 2014  4. Family history of prostate cancer - in uncles diagnosed in their 60s/70s, but he does not know if it was fatal prostate cancer or curative  5. ED - sildenafil 20 mg, no-demand-dosing  Chief Complaint  Patient presents with   UTI symptoms    HPI: Keith Gordon is a 83 y.o. man who presents today for burning, frequency, urgency, cloudy urine, not being able to empty completely and pain in his lower abdomen.  Previous records reviewed.  He states the above symptoms started last week and he called the office and a got appointment for this Thursday, but his symptoms worsened over the weekend.  He is having urinary urgency, frequency, nocturia, going 3-4 times an hour and feeling blah and tired.  Patient denies any modifying or aggravating factors.  Patient denies any recent UTI's, gross hematuria, or suprapubic/flank pain.  Patient denies any fevers, chills, nausea or vomiting.  He is voiding larger volumes of urine and then returning immediately back to the restroom with urgency and burning.  UA is yellow hazy, nitrite positive, greater than 50 WBCs, many bacteria and WBCs clumps present.  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   Arthritis    Asthma    as a child   Basal cell carcinoma 04/21/2007   Left helix. Excised 05/31/2007, margins free.   Bleeding ulcer 1982   Dysrhythmia    Family history of adverse reaction to anesthesia    son severe nausea and vomiting   Pneumonia 1960's   history of   PONV (postoperative nausea and vomiting)     Surgical History: Past Surgical  History:  Procedure Laterality Date   APPENDECTOMY  1968   CHOLECYSTECTOMY  2001   COLONOSCOPY WITH PROPOFOL  N/A 04/19/2018   Procedure: COLONOSCOPY WITH PROPOFOL ;  Surgeon: Toledo, Ladell POUR, MD;  Location: [facility] ENDOSCOPY;  Service: Endoscopy;  Laterality: N/A;   EYE SURGERY Right 2001   tear duct   OPEN SURGICAL REPAIR OF GLUTEAL TENDON Left 02/15/2017   Procedure: PRIMARY REPAIR OF THE GLUTEUS MEDIUS TENDON OF HIP;  Surgeon: Edie Norleen PARAS, MD;  Location: [facility] ORS;  Service: Orthopedics;  Laterality: Left;   ROTATOR CUFF REPAIR Left 1995 & 1997   TONSILLECTOMY  1951    Home Medications:  Allergies as of 07/16/2024       Reactions   Ether Nausea And Vomiting   Any anesthesia except propofol    Demerol [meperidine] Nausea And Vomiting   Dust Mite Extract Other (See Comments)   Levofloxacin Other (See Comments)   Spots on legs    Sulfa Antibiotics Rash        Medication List        Accurate as of July 16, 2024  1:58 PM. If you have any questions, ask your nurse or doctor.          albuterol 108 (90 Base) MCG/ACT inhaler Commonly known as: VENTOLIN HFA Inhale into the lungs.   Flovent HFA 220 MCG/ACT inhaler Generic drug: fluticasone Inhale 2 puffs into the lungs 2 (two) times daily.   meloxicam  15 MG tablet  Commonly known as: MOBIC  Take 1 tablet (15 mg total) by mouth daily.   methylPREDNISolone  4 MG Tbpk tablet Commonly known as: MEDROL  DOSEPAK 6 day dose pack - take as directed   pimecrolimus  1 % cream Commonly known as: Elidel  Apply topically as directed. Qd to bid aa itchy rash groin, L popliteal prn flares        Allergies:  Allergies  Allergen Reactions   Ether Nausea And Vomiting    Any anesthesia except propofol    Demerol [Meperidine] Nausea And Vomiting   Dust Mite Extract Other (See Comments)   Levofloxacin Other (See Comments)    Spots on legs    Sulfa Antibiotics Rash    Family History: No family history on file.  Social  History:  reports that he quit smoking about 59 years ago. His smoking use included cigarettes. He has never used smokeless tobacco. He reports that he does not drink alcohol and does not use drugs.  ROS: Pertinent ROS in HPI  Physical Exam: BP 127/78 (BP Location: Left Arm, Patient Position: Sitting, Cuff Size: Normal)   Pulse 88   Wt 199 lb (90.3 kg)   SpO2 98%   BMI 26.99 kg/m   Constitutional:  Well nourished. Alert and oriented, No acute distress. HEENT: [place] AT, moist mucus membranes.  Trachea midline Cardiovascular: No clubbing, cyanosis, or edema. Respiratory: Normal respiratory effort, no increased work of breathing. Neurologic: Grossly intact, no focal deficits, moving all 4 extremities. Psychiatric: Normal mood and affect.  Laboratory Data: See HPI and EPIC I have reviewed the labs.   Pertinent Imaging: N/A  Assessment & Plan:    1. Suspected UTI  -UA grossly infected  - Urine culture pending - Started empirically on amoxicillin  875 mg twice daily for 7 days, will adjust if necessary once urine culture and sensitivity results are available  - Advised patient to increase fluid intake and monitor symptoms - Counseled on UTI prevention (hydration)  - follow-up or call if no improvement within 48-72 hours or if symptoms worsen (fever, back pain) - follow up in one month for symptom recheck   2. Elevated PSA - will discuss more when he returns in one month  No follow-ups on file.  These notes generated with voice recognition software. I apologize for typographical errors.  Keith Gordon  [facility] [address]  [address] [place], [place] [address] [phone]

## 2024-07-16 NOTE — Telephone Encounter (Signed)
 Pt called in with [place], frequency, urgency, cloudy urine, not being able to empty completely and pain in his lower abdomen. Pt has no fever or nausea or vomiting.

## 2024-07-18 ENCOUNTER — Ambulatory Visit: Payer: Self-pay | Admitting: Urology

## 2024-07-18 LAB — URINE CULTURE: Culture: >=100000 — AB

## 2024-07-18 NOTE — Progress Notes (Signed)
Called patient and patient understood

## 2024-07-19 ENCOUNTER — Ambulatory Visit: Admitting: Urology

## 2024-08-13 NOTE — Progress Notes (Unsigned)
 08/14/2024 4:21 PM   Keith Gordon May 17, 1941 [id number]  Referring provider: Sadie Manna, MD [address] [facility] [place],  [place] [address]  Urological history: 1.  Elevated PSA - PSA (02/2023) 5.51  2. BPH with LU TS  3. Prostate nodule - present since 2014  4. Family history of prostate cancer - in uncles diagnosed in their 60s/70s, but he does not know if it was fatal prostate cancer or curative  5. ED - sildenafil 20 mg, no-demand-dosing  No chief complaint on file.  HPI: Keith Gordon is a 83 y.o. man who presents today for follow-up for Staphylococcus epidermidis UTI.  Previous records reviewed.  I PSS ***  He reports sensation of incomplete bladder emptying, urinary frequency, urinary intermittency, urinary urgency, a weak urinary stream, having to strain to void, nocturia x ***, leaking before being able to reach the restroom, leaking with coughing, leaking without awareness, and post void dribbling.     He is wearing *** pads//depends  daily.    Patient denies any modifying or aggravating factors.  Patient denies any recent UTI's, gross hematuria, dysuria or suprapubic/flank pain.  Patient denies any fevers, chills, nausea or vomiting.  ***  He has a family history of PCa, colon cancer, ovarian cancer and/or breast cancer with ***.   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   Arthritis    Asthma    as a child   Basal cell carcinoma 04/21/2007   Left helix. Excised 05/31/2007, margins free.   Bleeding ulcer 1982   Dysrhythmia    Family history of adverse reaction to anesthesia    son severe nausea and vomiting    Pneumonia 1960's   history of   PONV (postoperative nausea and vomiting)     Surgical History: Past Surgical History:  Procedure Laterality Date   APPENDECTOMY  1968   CHOLECYSTECTOMY  2001   COLONOSCOPY WITH PROPOFOL  N/A 04/19/2018   Procedure: COLONOSCOPY WITH PROPOFOL ;  Surgeon: Toledo, Ladell POUR, MD;  Location: [facility] ENDOSCOPY;  Service: Endoscopy;  Laterality: N/A;   EYE SURGERY Right 2001   tear duct   OPEN SURGICAL REPAIR OF GLUTEAL TENDON Left 02/15/2017   Procedure: PRIMARY REPAIR OF THE GLUTEUS MEDIUS TENDON OF HIP;  Surgeon: Edie Norleen PARAS, MD;  Location: [facility] ORS;  Service: Orthopedics;  Laterality: Left;   ROTATOR CUFF REPAIR Left 1995 & 1997   TONSILLECTOMY  1951    Home Medications:  Allergies as of 08/14/2024       Reactions   Ether Nausea And Vomiting   Any anesthesia except propofol    Demerol [meperidine] Nausea And Vomiting   Dust Mite Extract Other (See Comments)   Levofloxacin Other (See Comments)   Spots on legs    Sulfa Antibiotics Rash        Medication List        Accurate as of August 13, 2024  4:21 PM. If you have any questions, ask your nurse or doctor.          albuterol 108 (90 Base) MCG/ACT inhaler Commonly known as: VENTOLIN HFA Inhale into the lungs.   amoxicillin  875  MG tablet Commonly known as: AMOXIL  Take 1 tablet (875 mg total) by mouth every 12 (twelve) hours.   Flovent HFA 220 MCG/ACT inhaler Generic drug: fluticasone Inhale 2 puffs into the lungs 2 (two) times daily.   meloxicam  15 MG tablet Commonly known as: MOBIC  Take 1 tablet (15 mg total) by mouth daily.   methylPREDNISolone  4 MG Tbpk tablet Commonly known as: MEDROL  DOSEPAK 6 day dose pack - take as directed   pimecrolimus  1 % cream Commonly known as: Elidel  Apply topically as directed. Qd to bid aa itchy rash groin, L popliteal prn flares        Allergies:  Allergies  Allergen Reactions   Ether Nausea And Vomiting    Any anesthesia except  propofol    Demerol [Meperidine] Nausea And Vomiting   Dust Mite Extract Other (See Comments)   Levofloxacin Other (See Comments)    Spots on legs    Sulfa Antibiotics Rash    Family History: No family history on file.  Social History:  reports that he quit smoking about 59 years ago. His smoking use included cigarettes. He has never used smokeless tobacco. He reports that he does not drink alcohol and does not use drugs.  ROS: Pertinent ROS in HPI  Physical Exam: There were no vitals taken for this visit.  Constitutional:  Well nourished. Alert and oriented, No acute distress. HEENT: [place] AT, moist mucus membranes.  Trachea midline, no masses. Cardiovascular: No clubbing, cyanosis, or edema. Respiratory: Normal respiratory effort, no increased work of breathing. GI: Abdomen is soft, non tender, non distended, no abdominal masses. Liver and spleen not palpable.  No hernias appreciated.  Stool sample for occult testing is not indicated.   GU: No CVA tenderness.  No bladder fullness or masses.  Patient with circumcised/uncircumcised phallus. ***Foreskin easily retracted***  Urethral meatus is patent.  No penile discharge. No penile lesions or rashes. Scrotum without lesions, cysts, rashes and/or edema.  Testicles are located scrotally bilaterally. No masses are appreciated in the testicles. Left and right epididymis are normal. Rectal: Patient with  normal sphincter tone. Anus and perineum without scarring or rashes. No rectal masses are appreciated. Prostate is approximately *** grams, *** nodules are appreciated. Seminal vesicles are normal. Skin: No rashes, bruises or suspicious lesions. Lymph: No cervical or inguinal adenopathy. Neurologic: Grossly intact, no focal deficits, moving all 4 extremities. Psychiatric: Normal mood and affect.   Laboratory Data: See HPI and Epic I have reviewed the labs.   Pertinent Imaging: ***  Assessment & Plan:    1.  Staphylococcus UTI  -  ***  2. Elevated PSA - His PSA remains stable with no worrisome upward trend  No follow-ups on file.  These notes generated with voice recognition software. I apologize for typographical errors.  Keith Gordon  [facility] [address]  [address] [place], [place] [address] [phone]

## 2024-08-14 ENCOUNTER — Ambulatory Visit: Admitting: Urology

## 2024-08-14 ENCOUNTER — Encounter: Payer: Self-pay | Admitting: Urology

## 2024-08-14 VITALS — BP 132/74 | HR 73 | Ht 74.0 in | Wt 201.0 lb

## 2024-08-14 DIAGNOSIS — R972 Elevated prostate specific antigen [PSA]: Secondary | ICD-10-CM | POA: Diagnosis not present

## 2024-08-14 DIAGNOSIS — N201 Calculus of ureter: Secondary | ICD-10-CM

## 2024-08-14 DIAGNOSIS — R3914 Feeling of incomplete bladder emptying: Secondary | ICD-10-CM

## 2024-08-14 DIAGNOSIS — R300 Dysuria: Secondary | ICD-10-CM

## 2024-08-14 DIAGNOSIS — A498 Other bacterial infections of unspecified site: Secondary | ICD-10-CM

## 2024-08-14 DIAGNOSIS — N401 Enlarged prostate with lower urinary tract symptoms: Secondary | ICD-10-CM

## 2024-08-14 LAB — BLADDER SCAN AMB NON-IMAGING: Scan Result: 932

## 2024-08-14 MED ORDER — TAMSULOSIN HCL 0.4 MG PO CAPS: 0.4000 mg | ORAL_CAPSULE | Freq: Every day | ORAL | 3 refills | Status: DC

## 2024-08-14 MED ORDER — FINASTERIDE 5 MG PO TABS: 5.0000 mg | ORAL_TABLET | Freq: Every day | ORAL | 3 refills | Status: DC

## 2024-08-14 MED ORDER — TAMSULOSIN HCL 0.4 MG PO CAPS
0.4000 mg | ORAL_CAPSULE | Freq: Every day | ORAL | 3 refills | Status: DC
Start: 2024-08-14 — End: 2024-08-14

## 2024-08-14 MED ORDER — FINASTERIDE 5 MG PO TABS
5.0000 mg | ORAL_TABLET | Freq: Every day | ORAL | 3 refills | Status: DC
Start: 2024-08-14 — End: 2024-08-14

## 2024-08-17 ENCOUNTER — Ambulatory Visit
Admission: RE | Admit: 2024-08-17 | Discharge: 2024-08-17 | Disposition: A | Discharge status: Home / Self Care | Source: Ambulatory Visit | Attending: Urology | Admitting: Urology

## 2024-08-17 DIAGNOSIS — N401 Enlarged prostate with lower urinary tract symptoms: Secondary | ICD-10-CM | POA: Diagnosis present

## 2024-08-17 DIAGNOSIS — R3914 Feeling of incomplete bladder emptying: Secondary | ICD-10-CM | POA: Insufficient documentation

## 2024-08-17 DIAGNOSIS — N281 Cyst of kidney, acquired: Secondary | ICD-10-CM | POA: Diagnosis not present

## 2024-08-21 ENCOUNTER — Ambulatory Visit: Payer: Self-pay | Admitting: Urology

## 2024-08-21 NOTE — Progress Notes (Signed)
Called patient and patient understood

## 2024-09-25 ENCOUNTER — Other Ambulatory Visit
Admission: RE | Admit: 2024-09-25 | Discharge: 2024-09-25 | Disposition: A | Source: Ambulatory Visit | Attending: Ophthalmology | Admitting: Ophthalmology

## 2024-09-25 DIAGNOSIS — H532 Diplopia: Secondary | ICD-10-CM | POA: Insufficient documentation

## 2024-09-25 DIAGNOSIS — R519 Headache, unspecified: Secondary | ICD-10-CM | POA: Insufficient documentation

## 2024-09-25 LAB — CBC
HCT: 47 % (ref 39.0–52.0)
Hemoglobin: 15.6 g/dL (ref 13.0–17.0)
MCH: 30.1 pg (ref 26.0–34.0)
MCHC: 33.2 g/dL (ref 30.0–36.0)
MCV: 90.7 fL (ref 80.0–100.0)
Platelets: 154 K/uL (ref 150–400)
RBC: 5.18 MIL/uL (ref 4.22–5.81)
RDW: 13.3 % (ref 11.5–15.5)
WBC: 7.2 K/uL (ref 4.0–10.5)
nRBC: 0 % (ref 0.0–0.2)

## 2024-09-25 LAB — SEDIMENTATION RATE: Sed Rate: 1 mm/h (ref 0–20)

## 2024-09-25 LAB — C-REACTIVE PROTEIN: CRP: 0.8 mg/dL (ref <1.0)

## 2024-09-27 LAB — ACETYLCHOLINE RECEPTOR, BINDING: Acety choline binding ab: <0.07 nmol/L (ref 0.00–0.24)

## 2024-10-19 NOTE — Progress Notes (Signed)
 " Subjective:    Chief Complaint  Patient presents with   Nasal Congestion    PT states feeling congestion in head/chest, off balance, RN, some SOB, wheezing and coughing. No CP Taking OTC cough syrup, albuterol and Flovent.Advil Symptoms began 10/17/24    History was provided by the patient. Keith Gordon is a 83 y.o., male  who presents with 2-day history of sinus congestion, sore throat, chest congestion, cough, shortness of breath, wheezing, fatigue and malaise.  Reports feels unsteady on his feet.  Denies any specific known usual exposures or sick contacts.  No complaints of chest pain; no nausea, vomiting, diarrhea.  Using cough syrup, Advil, prescription meds without relief. Symptoms include: above   Patient denies: above Treatment to date: above   Past Medical History:  Diagnosis Date   Arrhythmia    COPD (chronic obstructive pulmonary disease) (CMS/HHS-HCC)    patient states he does not have COPD.   Diverticulitis    GERD (gastroesophageal reflux disease)    History of colon polyps    History of pneumonia 2006   Peptic ulcer disease 1982   The following portions of the patient's history were reviewed and updated as appropriate: allergies, current medications, past social history, and problem list.  Review of Systems A complete review of systems was performed.  Positive and pertinent negative responses are documented in the HPI, and all other systems are negative.   Objective:     Vitals:   10/19/24 1208  BP: (!) 140/88  Pulse: 104  Temp: 37.3 C (99.2 F)  TempSrc: Oral  SpO2: 94%  Weight: 92.5 kg (204 lb)  Height: 188 cm (6' 2)  PainSc: 0-No pain    General Appearance:  Well-developed, well-nourished.  Pleasant and cooperative.  No acute distress. HEENT:  [place]/AT, PERRLA, EOMI, sclera clear, conjunctivae pink.   Mouth and throat: Posterior oropharynx marked erythema, drainage; tongue midline. TM's: Moderate erythema on the right. Neck:  Supple,  no JVD or adenopathy. Cardiovascular:  Regular rate and rhythm.  Lungs: Diminished, coarse, paroxysmal cough. Abdomen:  Soft, nontender, nondistended. Normoactive bowel sounds.  Back: No CVA tenderness. Extremities:   No cyanosis, clubbing, or edema. Neuro:  Alert and orient x4; nonfocal.    Lab/X-ray/Treatments:   Extended respiratory viral panel-positive for influenza A       Assessment:      1. Influenza A -     oseltamivir (TAMIFLU) 75 MG capsule; Take 1 capsule (75 mg total) by mouth 2 (two) times daily for 5 days  Dispense: 10 capsule; Refill: 0  2. Acute bacterial sinusitis -     doxycycline  (VIBRAMYCIN ) 100 MG capsule; Take 1 capsule (100 mg total) by mouth 2 (two) times daily for 10 days  Dispense: 20 capsule; Refill: 0  3. Bronchitis with bronchospasm, unspecified -     predniSONE (DELTASONE) 10 MG tablet; Take 1 tablet (10 mg total) by mouth 2 (two) times daily for 5 days  Dispense: 10 tablet; Refill: 0 -     benzonatate (TESSALON) 200 MG capsule; Take 1 capsule (200 mg total) by mouth 3 (three) times daily as needed for Cough for up to 7 days  Dispense: 20 capsule; Refill: 0  4. Flu-like symptoms -     Extended Respiratory Viral Panel - [facility]; Future   Patient initially declines Tamiflu-reports it does not work for me.  Ultimately agrees to try.  Patient also requesting doxycycline -granted.  Also guaifenesin, low-dose prednisone, Tessalon Perles.  Fluids rest.  Caution against  driving if experiencing dizziness.  Call, return, ED if problems.    Plan:   Requested Prescriptions   Signed Prescriptions Disp Refills   oseltamivir (TAMIFLU) 75 MG capsule 10 capsule 0    Sig: Take 1 capsule (75 mg total) by mouth 2 (two) times daily for 5 days   doxycycline  (VIBRAMYCIN ) 100 MG capsule 20 capsule 0    Sig: Take 1 capsule (100 mg total) by mouth 2 (two) times daily for 10 days   predniSONE (DELTASONE) 10 MG tablet 10 tablet 0    Sig: Take 1 tablet (10 mg total)  by mouth 2 (two) times daily for 5 days   benzonatate (TESSALON) 200 MG capsule 20 capsule 0    Sig: Take 1 capsule (200 mg total) by mouth 3 (three) times daily as needed for Cough for up to 7 days    "

## 2024-11-13 NOTE — Progress Notes (Unsigned)
 "    11/14/2024 8:17 PM   Carlin ORN Eischen Aug 15, 1941 [id number]  Referring provider: Sadie Manna, MD [address] [facility] [place],  [place] [address]  Urological history: 1.  Elevated PSA - PSA (02/2023) 5.51  2. BPH with LU TS -PSA (052025) 4.16 -RUS (07/2024) BPH with 512 cc PVR   3. Prostate nodule - present since 2014  4. Family history of prostate cancer - in uncles diagnosed in their 60s/70s, but he does not know if it was fatal prostate cancer or curative  5. ED - sildenafil 20 mg, no-demand-dosing  No chief complaint on file.  HPI: Keith Gordon is a 84 y.o. man who presents today for follow-up for 89-month follow-up for monitoring of his elevated postvoid residuals.  Previous records reviewed.  I PSS ***  He reports sensation of incomplete bladder emptying,   urinary frequency,   urinary intermittency,   urinary urgency,   a weak urinary stream,   having to strain to void,   nocturia x ***,   leaking before being able to reach the restroom,   leaking with coughing,   leaking without awareness,   and post void dribbling.     He is wearing *** pads//depends  daily.    Patient denies any modifying or aggravating factors.  Patient denies any recent UTI's, gross hematuria, dysuria or suprapubic/flank pain.  Patient denies any fevers, chills, nausea or vomiting.  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   Arthritis    Asthma    as a child   Basal cell carcinoma 04/21/2007   Left helix. Excised 05/31/2007, margins free.   Bleeding ulcer 1982   Dysrhythmia    Family history of adverse reaction to anesthesia    son severe nausea and vomiting   Pneumonia 1960's   history of   PONV (postoperative nausea and  vomiting)     Surgical History: Past Surgical History:  Procedure Laterality Date   APPENDECTOMY  1968   CHOLECYSTECTOMY  2001   COLONOSCOPY WITH PROPOFOL  N/A 04/19/2018   Procedure: COLONOSCOPY WITH PROPOFOL ;  Surgeon: Toledo, Ladell POUR, MD;  Location: [facility] ENDOSCOPY;  Service: Endoscopy;  Laterality: N/A;   EYE SURGERY Right 2001   tear duct   OPEN SURGICAL REPAIR OF GLUTEAL TENDON Left 02/15/2017   Procedure: PRIMARY REPAIR OF THE GLUTEUS MEDIUS TENDON OF HIP;  Surgeon: Edie Norleen PARAS, MD;  Location: [facility] ORS;  Service: Orthopedics;  Laterality: Left;   ROTATOR CUFF REPAIR Left 1995 & 1997   TONSILLECTOMY  1951    Home Medications:  Allergies as of 11/14/2024       Reactions   Ether Nausea And Vomiting   Any anesthesia except propofol    Demerol [meperidine] Nausea And Vomiting   Dust Mite Extract Other (See Comments)   Levofloxacin Other (See Comments)   Spots on legs    Sulfa Antibiotics Rash        Medication List        Accurate as of November 13, 2024  8:17 PM. If you have any questions, ask your nurse or doctor.          albuterol 108 (90 Base) MCG/ACT inhaler Commonly known as: VENTOLIN HFA Inhale into the lungs.   amoxicillin  875 MG tablet  Commonly known as: AMOXIL  Take 1 tablet (875 mg total) by mouth every 12 (twelve) hours.   finasteride  5 MG tablet Commonly known as: PROSCAR  Take 1 tablet (5 mg total) by mouth daily.   Flovent HFA 220 MCG/ACT inhaler Generic drug: fluticasone Inhale 2 puffs into the lungs 2 (two) times daily.   meloxicam  15 MG tablet Commonly known as: MOBIC  Take 1 tablet (15 mg total) by mouth daily.   methylPREDNISolone  4 MG Tbpk tablet Commonly known as: MEDROL  DOSEPAK 6 day dose pack - take as directed   pimecrolimus  1 % cream Commonly known as: Elidel  Apply topically as directed. Qd to bid aa itchy rash groin, L popliteal prn flares   tamsulosin  0.4 MG Caps capsule Commonly known as: FLOMAX  Take 1 capsule (0.4 mg  total) by mouth daily.        Allergies:  Allergies  Allergen Reactions   Ether Nausea And Vomiting    Any anesthesia except propofol    Demerol [Meperidine] Nausea And Vomiting   Dust Mite Extract Other (See Comments)   Levofloxacin Other (See Comments)    Spots on legs    Sulfa Antibiotics Rash    Family History: No family history on file.  Social History:  reports that he quit smoking about 59 years ago. His smoking use included cigarettes. He has never used smokeless tobacco. He reports that he does not drink alcohol and does not use drugs.  ROS: Pertinent ROS in HPI  Physical Exam: There were no vitals taken for this visit.  Constitutional:  Well nourished. Alert and oriented, No acute distress. HEENT: [place] AT, moist mucus membranes.  Trachea midline, no masses. Cardiovascular: No clubbing, cyanosis, or edema. Respiratory: Normal respiratory effort, no increased work of breathing. GI: Abdomen is soft, non tender, non distended, no abdominal masses. Liver and spleen not palpable.  No hernias appreciated.  Stool sample for occult testing is not indicated.   GU: No CVA tenderness.  No bladder fullness or masses.  Patient with circumcised/uncircumcised phallus. ***Foreskin easily retracted***  Urethral meatus is patent.  No penile discharge. No penile lesions or rashes. Scrotum without lesions, cysts, rashes and/or edema.  Testicles are located scrotally bilaterally. No masses are appreciated in the testicles. Left and right epididymis are normal. Rectal: Patient with  normal sphincter tone. Anus and perineum without scarring or rashes. No rectal masses are appreciated. Prostate is approximately *** grams, *** nodules are appreciated. Seminal vesicles are normal. Skin: No rashes, bruises or suspicious lesions. Lymph: No cervical or inguinal adenopathy. Neurologic: Grossly intact, no focal deficits, moving all 4 extremities. Psychiatric: Normal mood and affect.   Laboratory  Data: See HPI and Epic I have reviewed the labs.   Pertinent Imaging: ***   Assessment & Plan:    1. BPH with incomplete bladder emptying  - stable, moderate symptoms  - PSA up to date  - PVR >300 cc  - encouraged avoiding bladder irritants, fluid restriction before bedtime and timed voiding's -Continue tamsulosin  0.4 mg daily and finasteride  5 mg daily  2. Elevated PSA - His PSA remains stable with no worrisome upward trend  No follow-ups on file.  These notes generated with voice recognition software. I apologize for typographical errors.  [name]  [facility] [address]  [address] [place], [place] [address] [phone]  "

## 2024-11-14 ENCOUNTER — Ambulatory Visit: Admitting: Urology

## 2024-11-14 ENCOUNTER — Encounter: Payer: Self-pay | Admitting: Urology

## 2024-11-14 VITALS — BP 132/78 | HR 82 | Wt 200.0 lb

## 2024-11-14 DIAGNOSIS — N401 Enlarged prostate with lower urinary tract symptoms: Secondary | ICD-10-CM

## 2024-11-14 DIAGNOSIS — R972 Elevated prostate specific antigen [PSA]: Secondary | ICD-10-CM

## 2024-11-14 DIAGNOSIS — R3914 Feeling of incomplete bladder emptying: Secondary | ICD-10-CM

## 2024-11-14 LAB — BLADDER SCAN AMB NON-IMAGING

## 2024-11-14 MED ORDER — TADALAFIL 5 MG PO TABS: 5.0000 mg | ORAL_TABLET | Freq: Every day | ORAL | 11 refills | Status: AC | PRN

## 2025-05-14 ENCOUNTER — Ambulatory Visit: Admitting: Urology

## 2025-05-28 ENCOUNTER — Ambulatory Visit: Admitting: Dermatology
